# Patient Record
Sex: Female | Born: 1975
Health system: Southern US, Community
[De-identification: ages and names within clinical notes are randomized; demographics above are authoritative.]

## PROBLEM LIST (undated history)

## (undated) ENCOUNTER — Emergency Department (HOSPITAL_COMMUNITY): Admission: EM | Payer: Self-pay | Source: Home / Self Care

## (undated) DIAGNOSIS — F419 Anxiety disorder, unspecified: Secondary | ICD-10-CM

## (undated) DIAGNOSIS — G8929 Other chronic pain: Secondary | ICD-10-CM

## (undated) DIAGNOSIS — T7840XA Allergy, unspecified, initial encounter: Secondary | ICD-10-CM

## (undated) DIAGNOSIS — M1990 Unspecified osteoarthritis, unspecified site: Secondary | ICD-10-CM

## (undated) DIAGNOSIS — Z789 Other specified health status: Secondary | ICD-10-CM

## (undated) HISTORY — DX: Anxiety disorder, unspecified: F41.9

## (undated) HISTORY — PX: NO PAST SURGERIES: SHX2092

## (undated) HISTORY — DX: Unspecified osteoarthritis, unspecified site: M19.90

## (undated) HISTORY — DX: Other specified health status: Z78.9

## (undated) HISTORY — DX: Other chronic pain: G89.29

## (undated) HISTORY — DX: Allergy, unspecified, initial encounter: T78.40XA

---

## 2003-06-10 ENCOUNTER — Encounter: Payer: Self-pay | Admitting: Family Medicine

## 2003-06-10 ENCOUNTER — Ambulatory Visit (HOSPITAL_COMMUNITY): Admission: RE | Admit: 2003-06-10 | Discharge: 2003-06-10 | Payer: Self-pay | Admitting: Family Medicine

## 2010-04-04 ENCOUNTER — Emergency Department (HOSPITAL_COMMUNITY): Admission: EM | Admit: 2010-04-04 | Discharge: 2010-04-04 | Payer: Self-pay | Admitting: Emergency Medicine

## 2012-07-02 ENCOUNTER — Other Ambulatory Visit (HOSPITAL_COMMUNITY): Payer: Self-pay | Admitting: Family Medicine

## 2012-07-02 DIAGNOSIS — N6320 Unspecified lump in the left breast, unspecified quadrant: Secondary | ICD-10-CM

## 2012-07-09 ENCOUNTER — Other Ambulatory Visit (HOSPITAL_COMMUNITY): Payer: Self-pay | Admitting: Family Medicine

## 2012-07-09 ENCOUNTER — Ambulatory Visit (HOSPITAL_COMMUNITY)
Admission: RE | Admit: 2012-07-09 | Discharge: 2012-07-09 | Disposition: A | Discharge status: Home / Self Care | Payer: Self-pay | Source: Ambulatory Visit | Attending: Family Medicine | Admitting: Family Medicine

## 2012-07-09 DIAGNOSIS — N6320 Unspecified lump in the left breast, unspecified quadrant: Secondary | ICD-10-CM

## 2012-07-09 DIAGNOSIS — Z803 Family history of malignant neoplasm of breast: Secondary | ICD-10-CM | POA: Insufficient documentation

## 2012-07-09 DIAGNOSIS — N6459 Other signs and symptoms in breast: Secondary | ICD-10-CM | POA: Insufficient documentation

## 2012-12-26 ENCOUNTER — Other Ambulatory Visit (HOSPITAL_COMMUNITY): Payer: Self-pay | Admitting: Internal Medicine

## 2012-12-26 DIAGNOSIS — K219 Gastro-esophageal reflux disease without esophagitis: Secondary | ICD-10-CM

## 2012-12-26 DIAGNOSIS — R1310 Dysphagia, unspecified: Secondary | ICD-10-CM

## 2012-12-26 DIAGNOSIS — R1314 Dysphagia, pharyngoesophageal phase: Secondary | ICD-10-CM

## 2012-12-29 ENCOUNTER — Ambulatory Visit (HOSPITAL_COMMUNITY)
Admission: RE | Admit: 2012-12-29 | Discharge: 2012-12-29 | Disposition: A | Discharge status: Home / Self Care | Payer: BC Managed Care – PPO | Source: Ambulatory Visit | Attending: Internal Medicine | Admitting: Internal Medicine

## 2012-12-29 DIAGNOSIS — R1310 Dysphagia, unspecified: Secondary | ICD-10-CM | POA: Insufficient documentation

## 2012-12-29 DIAGNOSIS — K219 Gastro-esophageal reflux disease without esophagitis: Secondary | ICD-10-CM

## 2012-12-29 DIAGNOSIS — R1314 Dysphagia, pharyngoesophageal phase: Secondary | ICD-10-CM

## 2013-08-06 ENCOUNTER — Encounter (INDEPENDENT_AMBULATORY_CARE_PROVIDER_SITE_OTHER): Payer: Self-pay

## 2013-08-06 ENCOUNTER — Ambulatory Visit (INDEPENDENT_AMBULATORY_CARE_PROVIDER_SITE_OTHER): Payer: BC Managed Care – PPO | Admitting: Otolaryngology

## 2013-08-06 DIAGNOSIS — H903 Sensorineural hearing loss, bilateral: Secondary | ICD-10-CM

## 2014-08-08 ENCOUNTER — Emergency Department (HOSPITAL_COMMUNITY)
Admission: EM | Admit: 2014-08-08 | Discharge: 2014-08-09 | Disposition: A | Discharge status: Home / Self Care | Payer: BC Managed Care – PPO | Attending: Emergency Medicine | Admitting: Emergency Medicine

## 2014-08-08 ENCOUNTER — Encounter (HOSPITAL_COMMUNITY): Payer: Self-pay

## 2014-08-08 DIAGNOSIS — M25519 Pain in unspecified shoulder: Secondary | ICD-10-CM | POA: Insufficient documentation

## 2014-08-08 DIAGNOSIS — R1010 Upper abdominal pain, unspecified: Secondary | ICD-10-CM | POA: Diagnosis present

## 2014-08-08 DIAGNOSIS — Z3202 Encounter for pregnancy test, result negative: Secondary | ICD-10-CM | POA: Insufficient documentation

## 2014-08-08 DIAGNOSIS — Z791 Long term (current) use of non-steroidal anti-inflammatories (NSAID): Secondary | ICD-10-CM | POA: Insufficient documentation

## 2014-08-08 DIAGNOSIS — R1013 Epigastric pain: Secondary | ICD-10-CM

## 2014-08-08 DIAGNOSIS — Z79899 Other long term (current) drug therapy: Secondary | ICD-10-CM | POA: Diagnosis not present

## 2014-08-08 LAB — CBC WITH DIFFERENTIAL/PLATELET
Basophils Absolute: 0 10*3/uL (ref 0.0–0.1)
Basophils Relative: 1 % (ref 0–1)
Eosinophils Absolute: 0.1 10*3/uL (ref 0.0–0.7)
Eosinophils Relative: 2 % (ref 0–5)
HCT: 34 % — ABNORMAL LOW (ref 36.0–46.0)
Hemoglobin: 12.1 g/dL (ref 12.0–15.0)
Lymphocytes Relative: 30 % (ref 12–46)
Lymphs Abs: 1.7 10*3/uL (ref 0.7–4.0)
MCH: 31.4 pg (ref 26.0–34.0)
MCHC: 35.6 g/dL (ref 30.0–36.0)
MCV: 88.3 fL (ref 78.0–100.0)
Monocytes Absolute: 0.3 10*3/uL (ref 0.1–1.0)
Monocytes Relative: 6 % (ref 3–12)
Neutro Abs: 3.5 10*3/uL (ref 1.7–7.7)
Neutrophils Relative %: 61 % (ref 43–77)
Platelets: 248 10*3/uL (ref 150–400)
RBC: 3.85 MIL/uL — ABNORMAL LOW (ref 3.87–5.11)
RDW: 11.8 % (ref 11.5–15.5)
WBC: 5.7 10*3/uL (ref 4.0–10.5)

## 2014-08-08 LAB — URINALYSIS, ROUTINE W REFLEX MICROSCOPIC
Bilirubin Urine: NEGATIVE
Glucose, UA: NEGATIVE mg/dL
Hgb urine dipstick: NEGATIVE
Ketones, ur: NEGATIVE mg/dL
Leukocytes, UA: NEGATIVE
Nitrite: NEGATIVE
Protein, ur: NEGATIVE mg/dL
Specific Gravity, Urine: <1.005 — ABNORMAL LOW (ref 1.005–1.030)
Urobilinogen, UA: 0.2 mg/dL (ref 0.0–1.0)
pH: 7 (ref 5.0–8.0)

## 2014-08-08 LAB — PREGNANCY, URINE: Preg Test, Ur: NEGATIVE

## 2014-08-08 MED ORDER — GI COCKTAIL ~~LOC~~
30.0000 mL | Freq: Once | ORAL | Status: AC
  Administered 2014-08-08: 30 mL via ORAL
  Filled 2014-08-08: qty 30

## 2014-08-08 MED ORDER — PANTOPRAZOLE SODIUM 40 MG PO TBEC
40.0000 mg | DELAYED_RELEASE_TABLET | Freq: Once | ORAL | Status: AC
  Administered 2014-08-08: 40 mg via ORAL
  Filled 2014-08-08: qty 1

## 2014-08-08 NOTE — ED Notes (Signed)
Patient states upper middle abdominal pain with reflux that is worse after she eats. Patient states bilateral upper back pain. Patient denies chest pain, but states shortness of breath.

## 2014-08-08 NOTE — ED Provider Notes (Signed)
CSN: [id number]     Arrival date & time 08/08/14  2126 History  This chart was scribed for Delora Fuel, MD by Randa Evens, ED Scribe. This patient was seen in room APA11/APA11 and the patient's care was started at 11:03 PM.    Chief Complaint  Patient presents with  . Abdominal Pain   Patient is a 38 y.o. female presenting with abdominal pain. The history is provided by the patient. No language interpreter was used.  Abdominal Pain Associated symptoms: no constipation, no diarrhea, no fever, no nausea and no vomiting    HPI Comments: Judy Thompson is a 38 y.o. female who presents to the Emergency Department complaining of progressively worsening sharp upper abdominal pain onset 4 days ago. Pt states she has had some shoulder pain but does not think it is related. Pt rates the pain 7/10. Pt describes the pain as indigestion. Pt states that after eating the pain worsens. Pt states she has tried antiacids that provide temporary relief. Pt states she has also taken Zantac with no relief. Pt denies n/v/d, constipation or fever.   History reviewed. No pertinent past medical history. History reviewed. No pertinent past surgical history. No family history on file. History  Substance Use Topics  . Smoking status: Never Smoker   . Smokeless tobacco: Not on file  . Alcohol Use: No   OB History    No data available     Review of Systems  Constitutional: Negative for fever.  Gastrointestinal: Positive for abdominal pain. Negative for nausea, vomiting, diarrhea and constipation.  All other systems reviewed and are negative.   Allergies  Review of patient's allergies indicates no known allergies.  Home Medications   Prior to Admission medications   Medication Sig Start Date End Date Taking? Authorizing Provider  baclofen (LIORESAL) 10 MG tablet Take 10 mg by mouth daily as needed. 07/10/14  Yes Historical Provider, MD  naproxen (NAPROSYN) 500 MG tablet Take 500 mg by mouth 2 (two)  times daily. 07/18/14  Yes Historical Provider, MD  Norgestimate-Ethinyl Estradiol Triphasic (TRI-SPRINTEC) 0.18/0.215/0.25 MG-35 MCG tablet Take 1 tablet by mouth daily.   Yes Historical Provider, MD  ranitidine (ZANTAC) 150 MG tablet Take 150 mg by mouth 2 (two) times daily. 07/18/14  Yes Historical Provider, MD  zonisamide (ZONEGRAN) 50 MG capsule Take 50 mg by mouth 2 (two) times daily. 07/10/14  Yes Historical Provider, MD   Triage Vitals: BP 104/89 mmHg  Pulse 69  Temp(Src) 97.8 F (36.6 C) (Oral)  Resp 20  Ht 5\' 3"  (1.6 m)  Wt 153 lb (69.4 kg)  BMI 27.11 kg/m2  SpO2 100%  LMP 07/25/2014  Physical Exam  Constitutional: She is oriented to person, place, and time. She appears well-developed and well-nourished. No distress.  HENT:  Head: Normocephalic and atraumatic.  Eyes: Conjunctivae and EOM are normal.  Neck: Neck supple. No tracheal deviation present.  Cardiovascular: Normal rate.   Pulmonary/Chest: Effort normal. No respiratory distress.  Abdominal: There is tenderness.  Mild tenderness epigastric area.   Musculoskeletal: Normal range of motion.  Neurological: She is alert and oriented to person, place, and time.  Skin: Skin is warm and dry.  Psychiatric: She has a normal mood and affect. Her behavior is normal.  Nursing note and vitals reviewed.   ED Course  Procedures (including critical care time) DIAGNOSTIC STUDIES: Oxygen Saturation is 100% on RA, normal by my interpretation.    COORDINATION OF CARE: 11:10 PM-Discussed treatment plan which includes GI cocktail,  pantoprazole, CBC panel, CMP, and UA with pt at bedside and pt agreed to plan.     Labs Review Results for orders placed or performed during the hospital encounter of 08/08/14  Urinalysis, Routine w reflex microscopic  Result Value Ref Range   Color, Urine YELLOW YELLOW   APPearance CLEAR CLEAR   Specific Gravity, Urine <1.005 (L) 1.005 - 1.030   pH 7.0 5.0 - 8.0   Glucose, UA NEGATIVE NEGATIVE  mg/dL   Hgb urine dipstick NEGATIVE NEGATIVE   Bilirubin Urine NEGATIVE NEGATIVE   Ketones, ur NEGATIVE NEGATIVE mg/dL   Protein, ur NEGATIVE NEGATIVE mg/dL   Urobilinogen, UA 0.2 0.0 - 1.0 mg/dL   Nitrite NEGATIVE NEGATIVE   Leukocytes, UA NEGATIVE NEGATIVE  Pregnancy, urine  Result Value Ref Range   Preg Test, Ur NEGATIVE NEGATIVE  Comprehensive metabolic panel  Result Value Ref Range   Sodium 140 137 - 147 mEq/L   Potassium 3.7 3.7 - 5.3 mEq/L   Chloride 104 96 - 112 mEq/L   CO2 26 19 - 32 mEq/L   Glucose, Bld 102 (H) 70 - 99 mg/dL   BUN 14 6 - 23 mg/dL   Creatinine, Ser 0.88 0.50 - 1.10 mg/dL   Calcium 9.3 8.4 - 10.5 mg/dL   Total Protein 6.8 6.0 - 8.3 g/dL   Albumin 4.0 3.5 - 5.2 g/dL   AST 13 0 - 37 U/L   ALT 12 0 - 35 U/L   Alkaline Phosphatase 105 39 - 117 U/L   Total Bilirubin <0.2 (L) 0.3 - 1.2 mg/dL   GFR calc non Af Amer 84 (L) >90 mL/min   GFR calc Af Amer >90 >90 mL/min   Anion gap 10 5 - 15  Lipase, blood  Result Value Ref Range   Lipase 47 11 - 59 U/L  CBC with Differential  Result Value Ref Range   WBC 5.7 4.0 - 10.5 K/uL   RBC 3.85 (L) 3.87 - 5.11 MIL/uL   Hemoglobin 12.1 12.0 - 15.0 g/dL   HCT 34.0 (L) 36.0 - 46.0 %   MCV 88.3 78.0 - 100.0 fL   MCH 31.4 26.0 - 34.0 pg   MCHC 35.6 30.0 - 36.0 g/dL   RDW 11.8 11.5 - 15.5 %   Platelets 248 150 - 400 K/uL   Neutrophils Relative % 61 43 - 77 %   Neutro Abs 3.5 1.7 - 7.7 K/uL   Lymphocytes Relative 30 12 - 46 %   Lymphs Abs 1.7 0.7 - 4.0 K/uL   Monocytes Relative 6 3 - 12 %   Monocytes Absolute 0.3 0.1 - 1.0 K/uL   Eosinophils Relative 2 0 - 5 %   Eosinophils Absolute 0.1 0.0 - 0.7 K/uL   Basophils Relative 1 0 - 1 %   Basophils Absolute 0.0 0.0 - 0.1 K/uL   MDM   Final diagnoses:  Epigastric pain       Epigastric pain concerning for possible gastritis or peptic ulcer disease. Doubt biliary tract disease. However, patient expresses concern about possible gallstones. She's given a  therapeutic trial of GI cocktail plus pantoprazole. Laboratory workup has come back, unremarkable including normal transaminases, alkaline phosphatase, bilirubin, and WBC.  She got partial relief of pain with GI cocktail. Pain is down to 5/10. Because of her concern for gallstones, outpatient gallbladder ultrasound is ordered. She is discharged with prescription for pantoprazole and is also give a take-home pack of oxycodone-acetaminophen. Follow up with PCP in the next week.  I personally performed the services described in this documentation, which was scribed in my presence. The recorded information has been reviewed and is accurate.       Delora Fuel, MD 19/59/74 [id number]

## 2014-08-08 NOTE — ED Notes (Signed)
Pt states upper abdominal pain for the past few days. Pt gets some relief from antiacids but returns after a few minutes. Pt denies any N/V/D.

## 2014-08-09 ENCOUNTER — Other Ambulatory Visit (HOSPITAL_COMMUNITY): Payer: Self-pay | Admitting: Emergency Medicine

## 2014-08-09 ENCOUNTER — Ambulatory Visit (HOSPITAL_COMMUNITY)
Admit: 2014-08-09 | Discharge: 2014-08-09 | Disposition: A | Discharge status: Home / Self Care | Payer: BC Managed Care – PPO | Source: Ambulatory Visit | Attending: Emergency Medicine | Admitting: Emergency Medicine

## 2014-08-09 DIAGNOSIS — R1013 Epigastric pain: Secondary | ICD-10-CM

## 2014-08-09 LAB — COMPREHENSIVE METABOLIC PANEL
ALT: 12 U/L (ref 0–35)
AST: 13 U/L (ref 0–37)
Albumin: 4 g/dL (ref 3.5–5.2)
Alkaline Phosphatase: 105 U/L (ref 39–117)
Anion gap: 10 (ref 5–15)
BUN: 14 mg/dL (ref 6–23)
CO2: 26 mEq/L (ref 19–32)
Calcium: 9.3 mg/dL (ref 8.4–10.5)
Chloride: 104 mEq/L (ref 96–112)
Creatinine, Ser: 0.88 mg/dL (ref 0.50–1.10)
GFR calc Af Amer: >90 mL/min (ref >90)
GFR calc non Af Amer: 84 mL/min — ABNORMAL LOW (ref >90)
Glucose, Bld: 102 mg/dL — ABNORMAL HIGH (ref 70–99)
Potassium: 3.7 mEq/L (ref 3.7–5.3)
Sodium: 140 mEq/L (ref 137–147)
Total Bilirubin: <0.2 mg/dL — ABNORMAL LOW (ref 0.3–1.2)
Total Protein: 6.8 g/dL (ref 6.0–8.3)

## 2014-08-09 LAB — LIPASE, BLOOD: Lipase: 47 U/L (ref 11–59)

## 2014-08-09 MED ORDER — OXYCODONE-ACETAMINOPHEN 5-325 MG PO TABS: 1.0000 | ORAL_TABLET | ORAL | Status: DC | PRN

## 2014-08-09 MED ORDER — OXYCODONE-ACETAMINOPHEN 5-325 MG PO TABS
1.0000 | ORAL_TABLET | Freq: Once | ORAL | Status: AC
  Administered 2014-08-09: 1 via ORAL
  Filled 2014-08-09: qty 1

## 2014-08-09 MED ORDER — PANTOPRAZOLE SODIUM 40 MG PO TBEC: 40.0000 mg | DELAYED_RELEASE_TABLET | Freq: Every day | ORAL | Status: DC

## 2014-08-09 NOTE — ED Notes (Signed)
Pt alert & oriented x4, stable gait. Patient given discharge instructions, paperwork & prescription(s). Patient  instructed to stop at the registration desk to finish any additional paperwork. Patient verbalized understanding. Pt left department w/ no further questions. 

## 2014-08-09 NOTE — ED Provider Notes (Signed)
Pt returns for outpatient abdominal US.    Pt given her test results. Encouraged to get the prescription for PPI filled and to f/u with Dr Gerarda Fraction if her symptoms aren't improving.    US Abdomen Complete  08/09/2014   CLINICAL DATA:  Epigastric pain for several days  EXAM: ULTRASOUND ABDOMEN COMPLETE  COMPARISON:  None.  FINDINGS: Gallbladder: No gallstones or wall thickening visualized. No sonographic Murphy sign noted.  Common bile duct: Diameter: Normal at 2 mm  Liver: No focal lesion identified. Within normal limits in parenchymal echogenicity.  IVC: No abnormality visualized.  Pancreas: Visualized portion unremarkable.  Spleen: Size and appearance within normal limits.  Right Kidney: Length: 11.2 cm. Echogenicity within normal limits. No mass or hydronephrosis visualized.  Left Kidney: Length: 11.6 cm. Echogenicity within normal limits. No mass or hydronephrosis visualized.  Abdominal aorta: No aneurysm visualized.  Other findings: No free fluid  IMPRESSION: Normal abdominal ultrasound.   Electronically Signed   By: Suzy Bouchard M.D.   On: 08/09/2014 14:08    Rolland Porter, MD, Alanson Aly, MD 08/09/14 [phone]

## 2014-08-09 NOTE — Discharge Instructions (Signed)
Abdominal Pain Many things can cause abdominal pain. Usually, abdominal pain is not caused by a disease and will improve without treatment. It can often be observed and treated at home. Your health care provider will do a physical exam and possibly order blood tests and X-rays to help determine the seriousness of your pain. However, in many cases, more time must pass before a clear cause of the pain can be found. Before that point, your health care provider may not know if you need more testing or further treatment. HOME CARE INSTRUCTIONS  Monitor your abdominal pain for any changes. The following actions may help to alleviate any discomfort you are experiencing:  Only take over-the-counter or prescription medicines as directed by your health care provider.  Do not take laxatives unless directed to do so by your health care provider.  Try a clear liquid diet (broth, tea, or water) as directed by your health care provider. Slowly move to a bland diet as tolerated. SEEK MEDICAL CARE IF:  You have unexplained abdominal pain.  You have abdominal pain associated with nausea or diarrhea.  You have pain when you urinate or have a bowel movement.  You experience abdominal pain that wakes you in the night.  You have abdominal pain that is worsened or improved by eating food.  You have abdominal pain that is worsened with eating fatty foods.  You have a fever. SEEK IMMEDIATE MEDICAL CARE IF:   Your pain does not go away within 2 hours.  You keep throwing up (vomiting).  Your pain is felt only in portions of the abdomen, such as the right side or the left lower portion of the abdomen.  You pass bloody or black tarry stools. MAKE SURE YOU:  Understand these instructions.   Will watch your condition.   Will get help right away if you are not doing well or get worse.  Document Released: 06/20/2005 Document Revised: 09/15/2013 Document Reviewed: 05/20/2013 [facility] Patient Information  2015 [place], [place]. This information is not intended to replace advice given to you by your health care provider. Make sure you discuss any questions you have with your health care provider.  Pantoprazole tablets What is this medicine? PANTOPRAZOLE (pan TOE pra zole) prevents the production of acid in the stomach. It is used to treat gastroesophageal reflux disease (GERD), inflammation of the esophagus, and Zollinger-Ellison syndrome. This medicine may be used for other purposes; ask your health care provider or pharmacist if you have questions. COMMON BRAND NAME(S): Protonix What should I tell my health care provider before I take this medicine? They need to know if you have any of these conditions: -liver disease -low levels of magnesium in the blood -an unusual or allergic reaction to omeprazole, lansoprazole, pantoprazole, rabeprazole, other medicines, foods, dyes, or preservatives -pregnant or trying to get pregnant -breast-feeding How should I use this medicine? Take this medicine by mouth. Swallow the tablets whole with a drink of water. Follow the directions on the prescription label. Do not crush, break, or chew. Take your medicine at regular intervals. Do not take your medicine more often than directed. Talk to your pediatrician regarding the use of this medicine in children. While this drug may be prescribed for children as young as 5 years for selected conditions, precautions do apply. Overdosage: If you think you have taken too much of this medicine contact a poison control center or emergency room at once. NOTE: This medicine is only for you. Do not share this medicine with others.  What if I miss a dose? If you miss a dose, take it as soon as you can. If it is almost time for your next dose, take only that dose. Do not take double or extra doses. What may interact with this medicine? Do not take this medicine with any of the following medications: -atazanavir -nelfinavir This  medicine may also interact with the following medications: -ampicillin -delavirdine -digoxin -diuretics -iron salts -medicines for fungal infections like ketoconazole, itraconazole and voriconazole -warfarin This list may not describe all possible interactions. Give your health care provider a list of all the medicines, herbs, non-prescription drugs, or dietary supplements you use. Also tell them if you smoke, drink alcohol, or use illegal drugs. Some items may interact with your medicine. What should I watch for while using this medicine? It can take several days before your stomach pain gets better. Check with your doctor or health care professional if your condition does not start to get better, or if it gets worse. You may need blood work done while you are taking this medicine. What side effects may I notice from receiving this medicine? Side effects that you should report to your doctor or health care professional as soon as possible: -allergic reactions like skin rash, itching or hives, swelling of the face, lips, or tongue -bone, muscle or joint pain -breathing problems -chest pain or chest tightness -dark yellow or brown urine -dizziness -fast, irregular heartbeat -feeling faint or lightheaded -fever or sore throat -muscle spasm -palpitations -redness, blistering, peeling or loosening of the skin, including inside the mouth -seizures -tremors -unusual bleeding or bruising -unusually weak or tired -yellowing of the eyes or skin Side effects that usually do not require medical attention (Report these to your doctor or health care professional if they continue or are bothersome.): -constipation -diarrhea -dry mouth -headache -nausea This list may not describe all possible side effects. Call your doctor for medical advice about side effects. You may report side effects to FDA at 1-800-FDA-1088. Where should I keep my medicine? Keep out of the reach of children. Store at  room temperature between 15 and 30 degrees C (59 and 86 degrees F). Protect from light and moisture. Throw away any unused medicine after the expiration date. NOTE: This sheet is a summary. It may not cover all possible information. If you have questions about this medicine, talk to your doctor, pharmacist, or health care provider.  2015, [employer] Standard. (2012-07-09 16:40:16)

## 2014-08-12 ENCOUNTER — Ambulatory Visit (INDEPENDENT_AMBULATORY_CARE_PROVIDER_SITE_OTHER): Payer: BC Managed Care – PPO | Admitting: Otolaryngology

## 2016-01-02 DIAGNOSIS — Z1389 Encounter for screening for other disorder: Secondary | ICD-10-CM | POA: Diagnosis not present

## 2016-01-02 DIAGNOSIS — Z6822 Body mass index (BMI) 22.0-22.9, adult: Secondary | ICD-10-CM | POA: Diagnosis not present

## 2016-01-02 DIAGNOSIS — Z3201 Encounter for pregnancy test, result positive: Secondary | ICD-10-CM | POA: Diagnosis not present

## 2016-01-02 DIAGNOSIS — N925 Other specified irregular menstruation: Secondary | ICD-10-CM | POA: Diagnosis not present

## 2016-01-10 ENCOUNTER — Encounter (HOSPITAL_COMMUNITY): Payer: Self-pay

## 2016-01-10 ENCOUNTER — Emergency Department (HOSPITAL_COMMUNITY)
Admission: EM | Admit: 2016-01-10 | Discharge: 2016-01-10 | Disposition: A | Discharge status: Home / Self Care | Payer: BLUE CROSS/BLUE SHIELD | Attending: Emergency Medicine | Admitting: Emergency Medicine

## 2016-01-10 ENCOUNTER — Emergency Department (HOSPITAL_COMMUNITY): Payer: BLUE CROSS/BLUE SHIELD

## 2016-01-10 DIAGNOSIS — O9989 Other specified diseases and conditions complicating pregnancy, childbirth and the puerperium: Secondary | ICD-10-CM | POA: Diagnosis not present

## 2016-01-10 DIAGNOSIS — O468X1 Other antepartum hemorrhage, first trimester: Secondary | ICD-10-CM

## 2016-01-10 DIAGNOSIS — O209 Hemorrhage in early pregnancy, unspecified: Secondary | ICD-10-CM | POA: Diagnosis not present

## 2016-01-10 DIAGNOSIS — Z79899 Other long term (current) drug therapy: Secondary | ICD-10-CM | POA: Insufficient documentation

## 2016-01-10 DIAGNOSIS — N939 Abnormal uterine and vaginal bleeding, unspecified: Secondary | ICD-10-CM | POA: Diagnosis not present

## 2016-01-10 DIAGNOSIS — Z3490 Encounter for supervision of normal pregnancy, unspecified, unspecified trimester: Secondary | ICD-10-CM

## 2016-01-10 DIAGNOSIS — O26851 Spotting complicating pregnancy, first trimester: Secondary | ICD-10-CM | POA: Insufficient documentation

## 2016-01-10 DIAGNOSIS — O208 Other hemorrhage in early pregnancy: Secondary | ICD-10-CM | POA: Diagnosis not present

## 2016-01-10 DIAGNOSIS — Z3A01 Less than 8 weeks gestation of pregnancy: Secondary | ICD-10-CM | POA: Diagnosis not present

## 2016-01-10 DIAGNOSIS — O418X10 Other specified disorders of amniotic fluid and membranes, first trimester, not applicable or unspecified: Secondary | ICD-10-CM

## 2016-01-10 LAB — BASIC METABOLIC PANEL
Anion gap: 11 (ref 5–15)
BUN: 11 mg/dL (ref 6–20)
CO2: 22 mmol/L (ref 22–32)
Calcium: 9.3 mg/dL (ref 8.9–10.3)
Chloride: 103 mmol/L (ref 101–111)
Creatinine, Ser: 0.64 mg/dL (ref 0.44–1.00)
GFR calc Af Amer: >60 mL/min (ref >60)
GFR calc non Af Amer: >60 mL/min (ref >60)
Glucose, Bld: 87 mg/dL (ref 65–99)
Potassium: 3.5 mmol/L (ref 3.5–5.1)
Sodium: 136 mmol/L (ref 135–145)

## 2016-01-10 LAB — CBC
HCT: 35.1 % — ABNORMAL LOW (ref 36.0–46.0)
Hemoglobin: 12.2 g/dL (ref 12.0–15.0)
MCH: 30.7 pg (ref 26.0–34.0)
MCHC: 34.8 g/dL (ref 30.0–36.0)
MCV: 88.2 fL (ref 78.0–100.0)
Platelets: 231 10*3/uL (ref 150–400)
RBC: 3.98 MIL/uL (ref 3.87–5.11)
RDW: 11.6 % (ref 11.5–15.5)
WBC: 9.5 10*3/uL (ref 4.0–10.5)

## 2016-01-10 LAB — HCG, QUANTITATIVE, PREGNANCY: hCG, Beta Chain, Quant, S: 28285 m[IU]/mL — ABNORMAL HIGH (ref <5)

## 2016-01-10 NOTE — ED Notes (Signed)
Pt [redacted]wks pregnant. New onset cramping and spotting tonight.

## 2016-01-10 NOTE — Discharge Instructions (Signed)

## 2016-01-10 NOTE — ED Provider Notes (Signed)
CSN: [id number]     Arrival date & time 01/10/16  0026 History   First MD Initiated Contact with Patient 01/10/16 [phone]     Chief Complaint  Patient presents with  . Abdominal Pain  . Vaginal Bleeding     HPI Patient is currently [redacted] weeks pregnant per her.  Her last menstrual period was proximal 6 weeks ago.  She developed new onset vaginal spotting and left lower quadrant crampy abdominal pain today which is what prompted her visit to the emergency department.  She is a G2 P1 with prior miscarriage 13 years ago.  She has a scheduled appointment with her obstetrician in 2 days.  No lightheadedness or weakness.  She reports the vaginal spotting a scant   History reviewed. No pertinent past medical history. History reviewed. No pertinent past surgical history. History reviewed. No pertinent family history. Social History  Substance Use Topics  . Smoking status: Never Smoker   . Smokeless tobacco: None  . Alcohol Use: No   OB History    No data available     Review of Systems  All other systems reviewed and are negative.     Allergies  Review of patient's allergies indicates no known allergies.  Home Medications   Prior to Admission medications   Medication Sig Start Date End Date Taking? Authorizing Provider  acetaminophen (TYLENOL) 500 MG tablet Take 500 mg by mouth every 6 (six) hours as needed for mild pain.   Yes Historical Provider, MD  Prenatal Vit-Fe Fumarate-FA (PRENATAL MULTIVITAMIN) TABS tablet Take 1 tablet by mouth daily at 12 noon.   Yes Historical Provider, MD   BP 120/65 mmHg  Pulse 78  Temp(Src) 97.8 F (36.6 C) (Oral)  Resp 20  Ht 5\' 6"  (1.676 m)  Wt 143 lb 6 oz (65.034 kg)  BMI 23.15 kg/m2  SpO2 98% Physical Exam  Constitutional: She is oriented to person, place, and time. She appears well-developed and well-nourished. No distress.  HENT:  Head: Normocephalic and atraumatic.  Eyes: EOM are normal.  Neck: Normal range of motion.  Cardiovascular:  Normal rate and regular rhythm.   Pulmonary/Chest: Effort normal.  Abdominal: Soft. She exhibits no distension. There is no tenderness.  Musculoskeletal: Normal range of motion.  Neurological: She is alert and oriented to person, place, and time.  Skin: Skin is warm and dry.  Psychiatric: She has a normal mood and affect. Judgment normal.  Nursing note and vitals reviewed.   ED Course  Procedures (including critical care time) Labs Review Labs Reviewed  HCG, QUANTITATIVE, PREGNANCY - Abnormal; Notable for the following:    hCG, Beta Chain, Quant, S 28285 (*)    All other components within normal limits  CBC - Abnormal; Notable for the following:    HCT 35.1 (*)    All other components within normal limits  BASIC METABOLIC PANEL    Imaging Review US Ob Comp Less 14 Wks  01/10/2016  CLINICAL DATA:  Vaginal bleeding since last night. Estimated gestational age by LMP is 7 weeks 0 days. Quantitative beta HCG is 20,285. EXAM: OBSTETRIC <14 WK [place] AND TRANSVAGINAL OB US TECHNIQUE: Both transabdominal and transvaginal ultrasound examinations were performed for complete evaluation of the gestation as well as the maternal uterus, adnexal regions, and pelvic cul-de-sac. Transvaginal technique was performed to assess early pregnancy. COMPARISON:  None. FINDINGS: Intrauterine gestational sac: A single intrauterine gestational sac is identified. Yolk sac:  Yolk sac is present. Embryo:  A single fetal pole is  identified. Cardiac Activity: Fetal cardiac activity is observed. Heart Rate: Not able to measure due to small size. CRL:  2.9  mm   5 w   6 d                  [place] EDC: 09/05/2016 Subchorionic hemorrhage: A small subchorionic hemorrhage is demonstrated, measuring 1.5 x 2.6 x 2 point 0 cm. Maternal uterus/adnexae: The uterus is retroverted. An exophytic fibroid is demonstrated posteriorly, measuring 2.4 cm maximal diameter. Both ovaries are visualized. Normal follicular changes on the left. Corpus luteal  cyst on the right. No abnormal adnexal masses. Small amount of free fluid in the pelvis. IMPRESSION: Single intrauterine pregnancy demonstrated. Estimated gestational age by crown-rump length is 5 weeks 6 days. Small subchorionic hemorrhage. Incidental note of a small uterine fibroid. Electronically Signed   By: Lucienne Capers M.D.   On: 01/10/2016 05:59   US Ob Transvaginal  01/10/2016  CLINICAL DATA:  Vaginal bleeding since last night. Estimated gestational age by LMP is 7 weeks 0 days. Quantitative beta HCG is 20,285. EXAM: OBSTETRIC <14 WK [place] AND TRANSVAGINAL OB US TECHNIQUE: Both transabdominal and transvaginal ultrasound examinations were performed for complete evaluation of the gestation as well as the maternal uterus, adnexal regions, and pelvic cul-de-sac. Transvaginal technique was performed to assess early pregnancy. COMPARISON:  None. FINDINGS: Intrauterine gestational sac: A single intrauterine gestational sac is identified. Yolk sac:  Yolk sac is present. Embryo:  A single fetal pole is identified. Cardiac Activity: Fetal cardiac activity is observed. Heart Rate: Not able to measure due to small size. CRL:  2.9  mm   5 w   6 d                  [place] EDC: 09/05/2016 Subchorionic hemorrhage: A small subchorionic hemorrhage is demonstrated, measuring 1.5 x 2.6 x 2 point 0 cm. Maternal uterus/adnexae: The uterus is retroverted. An exophytic fibroid is demonstrated posteriorly, measuring 2.4 cm maximal diameter. Both ovaries are visualized. Normal follicular changes on the left. Corpus luteal cyst on the right. No abnormal adnexal masses. Small amount of free fluid in the pelvis. IMPRESSION: Single intrauterine pregnancy demonstrated. Estimated gestational age by crown-rump length is 5 weeks 6 days. Small subchorionic hemorrhage. Incidental note of a small uterine fibroid. Electronically Signed   By: Lucienne Capers M.D.   On: 01/10/2016 05:59   I have personally reviewed and evaluated these images and  lab results as part of my medical decision-making.   EKG Interpretation None      MDM   Final diagnoses:  Intrauterine pregnancy  Subchorionic hemorrhage in first trimester  Vaginal spotting    Overall well-appearing.  Vital signs normal.  Hemoglobin normal.  Small subchorionic hemorrhage noted.  Scant vaginal spotting.  Patient has follow-up with OB/GYN artery scheduled this week for Thursday.  Discharge home in good condition.  Miscarriage precautions given.    Jola Schmidt, MD 01/10/16 [phone]

## 2016-01-12 ENCOUNTER — Ambulatory Visit (INDEPENDENT_AMBULATORY_CARE_PROVIDER_SITE_OTHER): Payer: BLUE CROSS/BLUE SHIELD | Admitting: Advanced Practice Midwife

## 2016-01-12 ENCOUNTER — Encounter: Payer: Self-pay | Admitting: Advanced Practice Midwife

## 2016-01-12 ENCOUNTER — Encounter (INDEPENDENT_AMBULATORY_CARE_PROVIDER_SITE_OTHER): Payer: BLUE CROSS/BLUE SHIELD | Admitting: Advanced Practice Midwife

## 2016-01-12 VITALS — BP 110/70 | HR 80 | Wt 143.0 lb

## 2016-01-12 DIAGNOSIS — Z3491 Encounter for supervision of normal pregnancy, unspecified, first trimester: Secondary | ICD-10-CM

## 2016-01-12 DIAGNOSIS — O09521 Supervision of elderly multigravida, first trimester: Secondary | ICD-10-CM

## 2016-01-12 DIAGNOSIS — Z331 Pregnant state, incidental: Secondary | ICD-10-CM

## 2016-01-12 DIAGNOSIS — Z0283 Encounter for blood-alcohol and blood-drug test: Secondary | ICD-10-CM

## 2016-01-12 DIAGNOSIS — Z1389 Encounter for screening for other disorder: Secondary | ICD-10-CM

## 2016-01-12 DIAGNOSIS — Z3490 Encounter for supervision of normal pregnancy, unspecified, unspecified trimester: Secondary | ICD-10-CM | POA: Insufficient documentation

## 2016-01-12 DIAGNOSIS — Z36 Encounter for antenatal screening of mother: Secondary | ICD-10-CM | POA: Diagnosis not present

## 2016-01-12 DIAGNOSIS — O09529 Supervision of elderly multigravida, unspecified trimester: Secondary | ICD-10-CM | POA: Insufficient documentation

## 2016-01-12 DIAGNOSIS — Z369 Encounter for antenatal screening, unspecified: Secondary | ICD-10-CM

## 2016-01-12 DIAGNOSIS — Z0189 Encounter for other specified special examinations: Secondary | ICD-10-CM | POA: Diagnosis not present

## 2016-01-12 LAB — POCT URINALYSIS DIPSTICK
Blood, UA: 1
Glucose, UA: NEGATIVE
Ketones, UA: NEGATIVE
Leukocytes, UA: NEGATIVE
Nitrite, UA: NEGATIVE
Protein, UA: NEGATIVE

## 2016-01-12 MED ORDER — PRENATE MINI 18-0.6-0.4-350 MG PO CAPS: 1.0000 | ORAL_CAPSULE | Freq: Every day | ORAL | Status: DC

## 2016-01-12 NOTE — Patient Instructions (Signed)

## 2016-01-12 NOTE — Progress Notes (Signed)
  Subjective:    Judy Thompson is a G2P0010 [redacted]w[redacted]d being seen today for her first obstetrical visit.  Her obstetrical history is significant for 1st trimester SAB 14 years ago.  .  Pregnancy history fully reviewed.  She had some spotting a few days ago; US showed viable IUP w/small [facility].  Barely any bleeding today.   Patient reports fatigue.  Filed Vitals:   01/12/16 1420  BP: 110/70  Pulse: 80  Weight: 143 lb (64.864 kg)    HISTORY: OB History  Gravida Para Term Preterm AB SAB TAB Ectopic Multiple Living  2    1 1         # Outcome Date GA Lbr Len/2nd Weight Sex Delivery Anes PTL Lv  2 Current           1 SAB 08/24/02             Past Medical History  Diagnosis Date  . Medical history non-contributory    Past Surgical History  Procedure Laterality Date  . No past surgeries     Family History  Problem Relation Age of Onset  . Hypertension Mother   . Cancer Mother     breast, colon  . Heart disease Father   . Diabetes Paternal Grandmother   . Hypertension Paternal Grandmother      Exam                                      System:     Skin: normal coloration and turgor, no rashes    Neurologic: oriented, normal, normal mood   Extremities: normal strength, tone, and muscle mass   HEENT PERRLA   Mouth/Teeth mucous membranes moist, normal dentition   Neck supple and no masses   Cardiovascular: regular rate and rhythm   Respiratory:  appears well, vitals normal, no respiratory distress, acyanotic   Abdomen: soft, non-tender;  FHR: 110 [place]          Assessment:    Pregnancy: G2P0010 Patient Active Problem List   Diagnosis Date Noted  . Supervision of normal pregnancy 01/12/2016  . AMA (advanced maternal age) multigravida 35+ 01/12/2016        Plan:     Initial labs drawn. Continue prenatal vitamins  Problem list reviewed and updated  Reviewed n/v relief measures and warning s/s to report  Reviewed recommended weight gain based on  pre-gravid BMI  Encouraged well-balanced diet Genetic Screening discussed Integrated Screen: requested.  Ultrasound discussed; fetal survey: requested.  Return in about 3 weeks (around 02/02/2016) for LROB.  CRESENZO-DISHMAN,Stela [place] 01/12/2016

## 2016-01-13 LAB — URINE CULTURE: Organism ID, Bacteria: NO GROWTH

## 2016-01-14 LAB — GC/CHLAMYDIA PROBE AMP
Chlamydia trachomatis, NAA: NEGATIVE
Neisseria gonorrhoeae by PCR: NEGATIVE

## 2016-01-20 LAB — HIV ANTIBODY (ROUTINE TESTING W REFLEX): HIV Screen 4th Generation wRfx: NONREACTIVE

## 2016-01-20 LAB — URINALYSIS, ROUTINE W REFLEX MICROSCOPIC
Bilirubin, UA: NEGATIVE
Glucose, UA: NEGATIVE
Ketones, UA: NEGATIVE
Leukocytes, UA: NEGATIVE
Nitrite, UA: NEGATIVE
Protein, UA: NEGATIVE
Specific Gravity, UA: 1.012 (ref 1.005–1.030)
Urobilinogen, Ur: 0.2 mg/dL (ref 0.2–1.0)
pH, UA: 7 (ref 5.0–7.5)

## 2016-01-20 LAB — HEPATITIS B SURFACE ANTIGEN: Hepatitis B Surface Ag: NEGATIVE

## 2016-01-20 LAB — MICROSCOPIC EXAMINATION
Casts: NONE SEEN /lpf
Epithelial Cells (non renal): >10 /hpf — AB (ref 0–10)

## 2016-01-20 LAB — PMP SCREEN PROFILE (10S), URINE
Amphetamine Screen, Ur: NEGATIVE ng/mL
Barbiturate Screen, Ur: NEGATIVE ng/mL
Benzodiazepine Screen, Urine: NEGATIVE ng/mL
Cannabinoids Ur Ql Scn: NEGATIVE ng/mL
Cocaine(Metab.)Screen, Urine: NEGATIVE ng/mL
Creatinine(Crt), U: 48 mg/dL (ref 20.0–300.0)
Methadone Scn, Ur: NEGATIVE ng/mL
Opiate Scrn, Ur: NEGATIVE ng/mL
Oxycodone+Oxymorphone Ur Ql Scn: NEGATIVE ng/mL
PCP Scrn, Ur: NEGATIVE ng/mL
Ph of Urine: 6.5 (ref 4.5–8.9)
Propoxyphene, Screen: NEGATIVE ng/mL

## 2016-01-20 LAB — RPR: RPR Ser Ql: NONREACTIVE

## 2016-01-20 LAB — ANTIBODY SCREEN: Antibody Screen: NEGATIVE

## 2016-01-20 LAB — CYSTIC FIBROSIS MUTATION 97: Interpretation: NOT DETECTED

## 2016-01-20 LAB — ABO/RH: Rh Factor: NEGATIVE

## 2016-01-20 LAB — RUBELLA SCREEN: Rubella Antibodies, IGG: 1.84 index (ref >0.99)

## 2016-01-20 LAB — VARICELLA ZOSTER ANTIBODY, IGG: Varicella zoster IgG: >4000 index (ref >165)

## 2016-02-02 ENCOUNTER — Ambulatory Visit (INDEPENDENT_AMBULATORY_CARE_PROVIDER_SITE_OTHER): Payer: Medicaid Other | Admitting: Advanced Practice Midwife

## 2016-02-02 ENCOUNTER — Encounter: Payer: Self-pay | Admitting: Advanced Practice Midwife

## 2016-02-02 VITALS — BP 118/60 | HR 84 | Wt 145.0 lb

## 2016-02-02 DIAGNOSIS — O09521 Supervision of elderly multigravida, first trimester: Secondary | ICD-10-CM | POA: Diagnosis not present

## 2016-02-02 DIAGNOSIS — Z1389 Encounter for screening for other disorder: Secondary | ICD-10-CM

## 2016-02-02 DIAGNOSIS — Z3481 Encounter for supervision of other normal pregnancy, first trimester: Secondary | ICD-10-CM

## 2016-02-02 DIAGNOSIS — Z3A01 Less than 8 weeks gestation of pregnancy: Secondary | ICD-10-CM | POA: Diagnosis not present

## 2016-02-02 DIAGNOSIS — Z3491 Encounter for supervision of normal pregnancy, unspecified, first trimester: Secondary | ICD-10-CM

## 2016-02-02 DIAGNOSIS — Z331 Pregnant state, incidental: Secondary | ICD-10-CM

## 2016-02-02 DIAGNOSIS — Z3682 Encounter for antenatal screening for nuchal translucency: Secondary | ICD-10-CM

## 2016-02-02 LAB — POCT URINALYSIS DIPSTICK
Blood, UA: NEGATIVE
Glucose, UA: NEGATIVE
Ketones, UA: NEGATIVE
Leukocytes, UA: NEGATIVE
Nitrite, UA: NEGATIVE
Protein, UA: NEGATIVE

## 2016-02-02 MED ORDER — DOXYLAMINE-PYRIDOXINE 10-10 MG PO TBEC: DELAYED_RELEASE_TABLET | ORAL | Status: DC

## 2016-02-02 NOTE — Patient Instructions (Signed)

## 2016-02-02 NOTE — Progress Notes (Signed)
G2P0010 [redacted]w[redacted]d Estimated Date of Delivery: 09/05/16  Blood pressure 118/60, pulse 84, weight 145 lb (65.772 kg).   BP weight and urine results all reviewed and noted.  Please refer to the obstetrical flow sheet for the fundal height and fetal heart rate documentation:  Patient  denies any bleeding and no rupture of membranes symptoms or regular contractions. Patient is without complaints othr than nausa All questions were answered.  Orders Placed This Encounter  Procedures  . US Fetal Nuchal Translucency Measurement  . POCT urinalysis dipstick    Plan:  Continued routine obstetrical care, sampls of and Rx for dicilgis  Return in about 3 weeks (around 02/23/2016) for LROB, US:NT+1st IT.

## 2016-02-09 DIAGNOSIS — Z029 Encounter for administrative examinations, unspecified: Secondary | ICD-10-CM

## 2016-02-22 ENCOUNTER — Ambulatory Visit (INDEPENDENT_AMBULATORY_CARE_PROVIDER_SITE_OTHER): Payer: BLUE CROSS/BLUE SHIELD | Admitting: Women's Health

## 2016-02-22 ENCOUNTER — Encounter: Payer: Self-pay | Admitting: Women's Health

## 2016-02-22 ENCOUNTER — Ambulatory Visit (INDEPENDENT_AMBULATORY_CARE_PROVIDER_SITE_OTHER): Payer: BLUE CROSS/BLUE SHIELD

## 2016-02-22 VITALS — BP 102/58 | HR 90 | Wt 145.0 lb

## 2016-02-22 DIAGNOSIS — Z36 Encounter for antenatal screening of mother: Secondary | ICD-10-CM

## 2016-02-22 DIAGNOSIS — O09521 Supervision of elderly multigravida, first trimester: Secondary | ICD-10-CM

## 2016-02-22 DIAGNOSIS — Z3A12 12 weeks gestation of pregnancy: Secondary | ICD-10-CM

## 2016-02-22 DIAGNOSIS — Z369 Encounter for antenatal screening, unspecified: Secondary | ICD-10-CM

## 2016-02-22 DIAGNOSIS — Z3481 Encounter for supervision of other normal pregnancy, first trimester: Secondary | ICD-10-CM

## 2016-02-22 DIAGNOSIS — O3491 Maternal care for abnormality of pelvic organ, unspecified, first trimester: Secondary | ICD-10-CM

## 2016-02-22 DIAGNOSIS — Z3491 Encounter for supervision of normal pregnancy, unspecified, first trimester: Secondary | ICD-10-CM

## 2016-02-22 DIAGNOSIS — Z1389 Encounter for screening for other disorder: Secondary | ICD-10-CM

## 2016-02-22 DIAGNOSIS — Z3682 Encounter for antenatal screening for nuchal translucency: Secondary | ICD-10-CM

## 2016-02-22 DIAGNOSIS — Z331 Pregnant state, incidental: Secondary | ICD-10-CM

## 2016-02-22 LAB — POCT URINALYSIS DIPSTICK
Blood, UA: NEGATIVE
Glucose, UA: NEGATIVE
Ketones, UA: NEGATIVE
Nitrite, UA: NEGATIVE
Protein, UA: NEGATIVE

## 2016-02-22 NOTE — Progress Notes (Signed)
Low-risk OB appointment G2P0010 [redacted]w[redacted]d Estimated Date of Delivery: 09/05/16 BP 102/58 mmHg  Pulse 90  Wt 145 lb (65.772 kg)  BP, weight, and urine reviewed.  Refer to obstetrical flow sheet for FH & FHR.  No fm yet. Denies cramping, lof, vb, or uti s/s. Headaches, apap helps some- gave printed prevention/relief measures.  Reviewed today's normal nt u/s, warning s/s to report. Plan:  Continue routine obstetrical care  F/U in 4wks for OB appointment and 2nd IT 1st IT/NT today

## 2016-02-22 NOTE — Progress Notes (Signed)
[place] 12 wks,measurements c/w dates,NB present,NT 1.4 mm,crl 59.0 mm,normal lt ov,simple corpus luteal cyst rt ov 2.7 x 1.7 x 2.4 cm

## 2016-02-22 NOTE — Patient Instructions (Signed)
For Headaches:   Stay well hydrated, drink enough water so that your urine is clear, sometimes if you are dehydrated you can get headaches  Eat small frequent meals and snacks, sometimes if you are hungry you can get headaches  Sometimes you get headaches during pregnancy from the pregnancy hormones  You can try tylenol (1-2 regular strength 325mg  or 1-2 extra strength 500mg ) as directed on the box. The least amount of medication that works is best.   Cool compresses (cool wet washcloth or ice pack) to area of head that is hurting  You can also try drinking a caffeinated drink to see if this will help  If not helping, try below:  For Prevention of Headaches/Migraines:  CoQ10 100mg  three times daily  Vitamin B2 400mg  daily  Magnesium Oxide 400-600mg  daily  If You Get a Bad Headache/Migraine:  Benadryl 25mg    Magnesium Oxide  1 large Gatorade  2 extra strength Tylenol (1,000mg  total)  1 cup coffee or Coke  If this doesn't help please call us @ [phone]   First Trimester of Pregnancy The first trimester of pregnancy is from week 1 until the end of week 12 (months 1 through 3). A week after a sperm fertilizes an egg, the egg will implant on the wall of the uterus. This embryo will begin to develop into a baby. Genes from you and your partner are forming the baby. The female genes determine whether the baby is a boy or a girl. At 6-8 weeks, the eyes and face are formed, and the heartbeat can be seen on ultrasound. At the end of 12 weeks, all the baby's organs are formed.  Now that you are pregnant, you will want to do everything you can to have a healthy baby. Two of the most important things are to get good prenatal care and to follow your health care provider's instructions. Prenatal care is all the medical care you receive before the baby's birth. This care will help prevent, find, and treat any problems during the pregnancy and childbirth. BODY CHANGES Your body goes  through many changes during pregnancy. The changes vary from woman to woman.  4. You may gain or lose a couple of pounds at first. 5. You may feel sick to your stomach (nauseous) and throw up (vomit). If the vomiting is uncontrollable, call your health care provider. 6. You may tire easily. 7. You may develop headaches that can be relieved by medicines approved by your health care provider. 8. You may urinate more often. Painful urination may mean you have a bladder infection. 9. You may develop heartburn as a result of your pregnancy. 10. You may develop constipation because certain hormones are causing the muscles that push waste through your intestines to slow down. 11. You may develop hemorrhoids or swollen, bulging veins (varicose veins). 12. Your breasts may begin to grow larger and become tender. Your nipples may stick out more, and the tissue that surrounds them (areola) may become darker. 13. Your gums may bleed and may be sensitive to brushing and flossing. 14. Dark spots or blotches (chloasma, mask of pregnancy) may develop on your face. This will likely fade after the baby is born. 15. Your menstrual periods will stop. 16. You may have a loss of appetite. 17. You may develop cravings for certain kinds of food. 70. You may have changes in your emotions from day to day, such as being excited to be pregnant or being concerned that something may go wrong with  the pregnancy and baby. 12. You may have more vivid and strange dreams. 20. You may have changes in your hair. These can include thickening of your hair, rapid growth, and changes in texture. Some women also have hair loss during or after pregnancy, or hair that feels dry or thin. Your hair will most likely return to normal after your baby is born. WHAT TO EXPECT AT YOUR PRENATAL VISITS During a routine prenatal visit: 71. You will be weighed to make sure you and the baby are growing normally. 14. Your blood pressure will be  taken. 15. Your abdomen will be measured to track your baby's growth. 16. The fetal heartbeat will be listened to starting around week 10 or 12 of your pregnancy. 17. Test results from any previous visits will be discussed. Your health care provider may ask you:  How you are feeling.  If you are feeling the baby move.  If you have had any abnormal symptoms, such as leaking fluid, bleeding, severe headaches, or abdominal cramping.  If you are using any tobacco products, including cigarettes, chewing tobacco, and electronic cigarettes.  If you have any questions. Other tests that may be performed during your first trimester include:  Blood tests to find your blood type and to check for the presence of any previous infections. They will also be used to check for low iron levels (anemia) and Rh antibodies. Later in the pregnancy, blood tests for diabetes will be done along with other tests if problems develop.  Urine tests to check for infections, diabetes, or protein in the urine.  An ultrasound to confirm the proper growth and development of the baby.  An amniocentesis to check for possible genetic problems.  Fetal screens for spina bifida and Down syndrome.  You may need other tests to make sure you and the baby are doing well.  HIV (human immunodeficiency virus) testing. Routine prenatal testing includes screening for HIV, unless you choose not to have this test. HOME CARE INSTRUCTIONS  Medicines  Follow your health care provider's instructions regarding medicine use. Specific medicines may be either safe or unsafe to take during pregnancy.  Take your prenatal vitamins as directed.  If you develop constipation, try taking a stool softener if your health care provider approves. Diet  Eat regular, well-balanced meals. Choose a variety of foods, such as meat or vegetable-based protein, fish, milk and low-fat dairy products, vegetables, fruits, and whole grain breads and cereals.  Your health care provider will help you determine the amount of weight gain that is right for you.  Avoid raw meat and uncooked cheese. These carry germs that can cause birth defects in the baby.  Eating four or five small meals rather than three large meals a day may help relieve nausea and vomiting. If you start to feel nauseous, eating a few soda crackers can be helpful. Drinking liquids between meals instead of during meals also seems to help nausea and vomiting.  If you develop constipation, eat more high-fiber foods, such as fresh vegetables or fruit and whole grains. Drink enough fluids to keep your urine clear or pale yellow. Activity and Exercise  Exercise only as directed by your health care provider. Exercising will help you:  Control your weight.  Stay in shape.  Be prepared for labor and delivery.  Experiencing pain or cramping in the lower abdomen or low back is a good sign that you should stop exercising. Check with your health care provider before continuing normal exercises.  Try  to avoid standing for long periods of time. Move your legs often if you must stand in one place for a long time.  Avoid heavy lifting.  Wear low-heeled shoes, and practice good posture.  You may continue to have sex unless your health care provider directs you otherwise. Relief of Pain or Discomfort  Wear a good support bra for breast tenderness.   Take warm sitz baths to soothe any pain or discomfort caused by hemorrhoids. Use hemorrhoid cream if your health care provider approves.   Rest with your legs elevated if you have leg cramps or low back pain.  If you develop varicose veins in your legs, wear support hose. Elevate your feet for 15 minutes, 3-4 times a day. Limit salt in your diet. Prenatal Care  Schedule your prenatal visits by the twelfth week of pregnancy. They are usually scheduled monthly at first, then more often in the last 2 months before delivery.  Write down your  questions. Take them to your prenatal visits.  Keep all your prenatal visits as directed by your health care provider. Safety  Wear your seat belt at all times when driving.  Make a list of emergency phone numbers, including numbers for family, friends, the hospital, and police and fire departments. General Tips  Ask your health care provider for a referral to a local prenatal education class. Begin classes no later than at the beginning of month 6 of your pregnancy.  Ask for help if you have counseling or nutritional needs during pregnancy. Your health care provider can offer advice or refer you to specialists for help with various needs.  Do not use hot tubs, steam rooms, or saunas.  Do not douche or use tampons or scented sanitary pads.  Do not cross your legs for long periods of time.  Avoid cat litter boxes and soil used by cats. These carry germs that can cause birth defects in the baby and possibly loss of the fetus by miscarriage or stillbirth.  Avoid all smoking, herbs, alcohol, and medicines not prescribed by your health care provider. Chemicals in these affect the formation and growth of the baby.  Do not use any tobacco products, including cigarettes, chewing tobacco, and electronic cigarettes. If you need help quitting, ask your health care provider. You may receive counseling support and other resources to help you quit.  Schedule a dentist appointment. At home, brush your teeth with a soft toothbrush and be gentle when you floss. SEEK MEDICAL CARE IF:   You have dizziness.  You have mild pelvic cramps, pelvic pressure, or nagging pain in the abdominal area.  You have persistent nausea, vomiting, or diarrhea.  You have a bad smelling vaginal discharge.  You have pain with urination.  You notice increased swelling in your face, hands, legs, or ankles. SEEK IMMEDIATE MEDICAL CARE IF:   You have a fever.  You are leaking fluid from your vagina.  You have  spotting or bleeding from your vagina.  You have severe abdominal cramping or pain.  You have rapid weight gain or loss.  You vomit blood or material that looks like coffee grounds.  You are exposed to [place] measles and have never had them.  You are exposed to fifth disease or chickenpox.  You develop a severe headache.  You have shortness of breath.  You have any kind of trauma, such as from a fall or a car accident.   This information is not intended to replace advice given to you by  your health care provider. Make sure you discuss any questions you have with your health care provider.   Document Released: 09/04/2001 Document Revised: 10/01/2014 Document Reviewed: 07/21/2013 Elsevier Interactive Patient Education [employer].

## 2016-03-21 ENCOUNTER — Ambulatory Visit (INDEPENDENT_AMBULATORY_CARE_PROVIDER_SITE_OTHER): Payer: BLUE CROSS/BLUE SHIELD | Admitting: Women's Health

## 2016-03-21 ENCOUNTER — Encounter: Payer: Self-pay | Admitting: Women's Health

## 2016-03-21 VITALS — BP 110/60 | HR 88 | Wt 148.0 lb

## 2016-03-21 DIAGNOSIS — Z36 Encounter for antenatal screening of mother: Secondary | ICD-10-CM | POA: Diagnosis not present

## 2016-03-21 DIAGNOSIS — Z3492 Encounter for supervision of normal pregnancy, unspecified, second trimester: Secondary | ICD-10-CM

## 2016-03-21 DIAGNOSIS — Z3682 Encounter for antenatal screening for nuchal translucency: Secondary | ICD-10-CM

## 2016-03-21 DIAGNOSIS — Z3482 Encounter for supervision of other normal pregnancy, second trimester: Secondary | ICD-10-CM

## 2016-03-21 DIAGNOSIS — O99512 Diseases of the respiratory system complicating pregnancy, second trimester: Secondary | ICD-10-CM

## 2016-03-21 DIAGNOSIS — Z3A16 16 weeks gestation of pregnancy: Secondary | ICD-10-CM

## 2016-03-21 DIAGNOSIS — Z331 Pregnant state, incidental: Secondary | ICD-10-CM

## 2016-03-21 DIAGNOSIS — Z363 Encounter for antenatal screening for malformations: Secondary | ICD-10-CM

## 2016-03-21 DIAGNOSIS — Z1389 Encounter for screening for other disorder: Secondary | ICD-10-CM

## 2016-03-21 LAB — POCT URINALYSIS DIPSTICK
Blood, UA: NEGATIVE
Glucose, UA: NEGATIVE
Ketones, UA: NEGATIVE
Leukocytes, UA: NEGATIVE
Nitrite, UA: NEGATIVE
Protein, UA: NEGATIVE

## 2016-03-21 NOTE — Progress Notes (Signed)
Low-risk OB appointment [id number] [redacted]w[redacted]d Estimated Date of Delivery: 09/05/16 BP 110/60 mmHg  Pulse 88  Wt 148 lb (67.132 kg)  BP, weight, and urine reviewed.  Refer to obstetrical flow sheet for FH & FHR.  No fm yet. Denies cramping, lof, vb, or uti s/s. Non-productive cough x few weeks, some congestion/allergies- to try claritin or zyrtec, can use robitussin or cough drops HRRR, LCTAB Reviewed warning s/s to report. Plan:  Continue routine obstetrical care  F/U in 3wks for OB appointment and anatomy u/s 2nd IT today

## 2016-03-21 NOTE — Patient Instructions (Signed)
Claritin or Zyrtec Cough drops, Robitusin  Second Trimester of Pregnancy The second trimester is from week 13 through week 28, months 4 through 6. The second trimester is often a time when you feel your best. Your body has also adjusted to being pregnant, and you begin to feel better physically. Usually, morning sickness has lessened or quit completely, you may have more energy, and you may have an increase in appetite. The second trimester is also a time when the fetus is growing rapidly. At the end of the sixth month, the fetus is about 9 inches long and weighs about 1 pounds. You will likely begin to feel the baby move (quickening) between 18 and 20 weeks of the pregnancy. BODY CHANGES Your body goes through many changes during pregnancy. The changes vary from woman to woman.   Your weight will continue to increase. You will notice your lower abdomen bulging out.  You may begin to get stretch marks on your hips, abdomen, and breasts.  You may develop headaches that can be relieved by medicines approved by your health care provider.  You may urinate more often because the fetus is pressing on your bladder.  You may develop or continue to have heartburn as a result of your pregnancy.  You may develop constipation because certain hormones are causing the muscles that push waste through your intestines to slow down.  You may develop hemorrhoids or swollen, bulging veins (varicose veins).  You may have back pain because of the weight gain and pregnancy hormones relaxing your joints between the bones in your pelvis and as a result of a shift in weight and the muscles that support your balance.  Your breasts will continue to grow and be tender.  Your gums may bleed and may be sensitive to brushing and flossing.  Dark spots or blotches (chloasma, mask of pregnancy) may develop on your face. This will likely fade after the baby is born.  A dark line from your belly button to the pubic area  (linea nigra) may appear. This will likely fade after the baby is born.  You may have changes in your hair. These can include thickening of your hair, rapid growth, and changes in texture. Some women also have hair loss during or after pregnancy, or hair that feels dry or thin. Your hair will most likely return to normal after your baby is born. WHAT TO EXPECT AT YOUR PRENATAL VISITS During a routine prenatal visit:  You will be weighed to make sure you and the fetus are growing normally.  Your blood pressure will be taken.  Your abdomen will be measured to track your baby's growth.  The fetal heartbeat will be listened to.  Any test results from the previous visit will be discussed. Your health care provider may ask you:  How you are feeling.  If you are feeling the baby move.  If you have had any abnormal symptoms, such as leaking fluid, bleeding, severe headaches, or abdominal cramping.  If you are using any tobacco products, including cigarettes, chewing tobacco, and electronic cigarettes.  If you have any questions. Other tests that may be performed during your second trimester include:  Blood tests that check for:  Low iron levels (anemia).  Gestational diabetes (between 24 and 28 weeks).  Rh antibodies.  Urine tests to check for infections, diabetes, or protein in the urine.  An ultrasound to confirm the proper growth and development of the baby.  An amniocentesis to check for possible genetic  problems.  Fetal screens for spina bifida and Down syndrome.  HIV (human immunodeficiency virus) testing. Routine prenatal testing includes screening for HIV, unless you choose not to have this test. HOME CARE INSTRUCTIONS   Avoid all smoking, herbs, alcohol, and unprescribed drugs. These chemicals affect the formation and growth of the baby.  Do not use any tobacco products, including cigarettes, chewing tobacco, and electronic cigarettes. If you need help quitting, ask  your health care provider. You may receive counseling support and other resources to help you quit.  Follow your health care provider's instructions regarding medicine use. There are medicines that are either safe or unsafe to take during pregnancy.  Exercise only as directed by your health care provider. Experiencing uterine cramps is a good sign to stop exercising.  Continue to eat regular, healthy meals.  Wear a good support bra for breast tenderness.  Do not use hot tubs, steam rooms, or saunas.  Wear your seat belt at all times when driving.  Avoid raw meat, uncooked cheese, cat litter boxes, and soil used by cats. These carry germs that can cause birth defects in the baby.  Take your prenatal vitamins.  Take 1500-2000 mg of calcium daily starting at the 20th week of pregnancy until you deliver your baby.  Try taking a stool softener (if your health care provider approves) if you develop constipation. Eat more high-fiber foods, such as fresh vegetables or fruit and whole grains. Drink plenty of fluids to keep your urine clear or pale yellow.  Take warm sitz baths to soothe any pain or discomfort caused by hemorrhoids. Use hemorrhoid cream if your health care provider approves.  If you develop varicose veins, wear support hose. Elevate your feet for 15 minutes, 3-4 times a day. Limit salt in your diet.  Avoid heavy lifting, wear low heel shoes, and practice good posture.  Rest with your legs elevated if you have leg cramps or low back pain.  Visit your dentist if you have not gone yet during your pregnancy. Use a soft toothbrush to brush your teeth and be gentle when you floss.  A sexual relationship may be continued unless your health care provider directs you otherwise.  Continue to go to all your prenatal visits as directed by your health care provider. SEEK MEDICAL CARE IF:   You have dizziness.  You have mild pelvic cramps, pelvic pressure, or nagging pain in the  abdominal area.  You have persistent nausea, vomiting, or diarrhea.  You have a bad smelling vaginal discharge.  You have pain with urination. SEEK IMMEDIATE MEDICAL CARE IF:   You have a fever.  You are leaking fluid from your vagina.  You have spotting or bleeding from your vagina.  You have severe abdominal cramping or pain.  You have rapid weight gain or loss.  You have shortness of breath with chest pain.  You notice sudden or extreme swelling of your face, hands, ankles, feet, or legs.  You have not felt your baby move in over an hour.  You have severe headaches that do not go away with medicine.  You have vision changes.   This information is not intended to replace advice given to you by your health care provider. Make sure you discuss any questions you have with your health care provider.   Document Released: 09/04/2001 Document Revised: 10/01/2014 Document Reviewed: 11/11/2012 Elsevier Interactive Patient Education [employer].

## 2016-03-29 ENCOUNTER — Telehealth: Payer: Self-pay | Admitting: *Deleted

## 2016-03-29 NOTE — Telephone Encounter (Signed)
-----   Message from Roma Schanz, [place] sent at 03/28/2016  1:52 PM EDT ----- Regarding: 2nd IT Hey Hasaan Radde, can you call [facility] and give them the needed info to complete this 2nd IT please.  Thanks! ----- Message -----    From: Lenis Noon, CMA    Sent: 03/21/2016   1:49 PM      To: Roma Schanz, CNM

## 2016-03-29 NOTE — Telephone Encounter (Signed)
Called [facility] and gave missing data for NTIT to be processed.

## 2016-03-30 LAB — MATERNAL SCREEN, INTEGRATED #1
Crown Rump Length: 59 mm
Gest. Age on Collection Date: 12.4 weeks
Maternal Age at EDD: 38 years
Nuchal Translucency (NT): 1.4 mm
Number of Fetuses: 1
PAPP-A Value: 662.2 ng/mL
Weight: 145 [lb_av]

## 2016-03-30 LAB — MATERNAL SCREEN, INTEGRATED #2
AFP MoM: 0.83
Alpha-Fetoprotein: 27.5 ng/mL
Crown Rump Length: 59 mm
DIA MoM: 0.56
DIA Value: 99.2 pg/mL
Estriol, Unconjugated: 1.23 ng/mL
Gest. Age on Collection Date: 12.4 weeks
Gestational Age: 16.4 weeks
Maternal Age at EDD: 38 years
Nuchal Translucency (NT): 1.4 mm
Nuchal Translucency MoM: 0.92
Number of Fetuses: 1
PAPP-A MoM: 0.68
PAPP-A Value: 662.2 ng/mL
Test Results:: NEGATIVE
Weight: 145 [lb_av]
Weight: 148 [lb_av]
hCG MoM: 0.57
hCG Value: 18.9 IU/mL
uE3 MoM: 1.38

## 2016-04-10 ENCOUNTER — Ambulatory Visit (INDEPENDENT_AMBULATORY_CARE_PROVIDER_SITE_OTHER): Payer: BLUE CROSS/BLUE SHIELD | Admitting: Women's Health

## 2016-04-10 ENCOUNTER — Encounter: Payer: Self-pay | Admitting: Women's Health

## 2016-04-10 ENCOUNTER — Ambulatory Visit (INDEPENDENT_AMBULATORY_CARE_PROVIDER_SITE_OTHER): Payer: BLUE CROSS/BLUE SHIELD

## 2016-04-10 VITALS — BP 100/62 | HR 90 | Wt 152.5 lb

## 2016-04-10 DIAGNOSIS — Z3492 Encounter for supervision of normal pregnancy, unspecified, second trimester: Secondary | ICD-10-CM

## 2016-04-10 DIAGNOSIS — Z1389 Encounter for screening for other disorder: Secondary | ICD-10-CM

## 2016-04-10 DIAGNOSIS — Z3A19 19 weeks gestation of pregnancy: Secondary | ICD-10-CM | POA: Diagnosis not present

## 2016-04-10 DIAGNOSIS — Z331 Pregnant state, incidental: Secondary | ICD-10-CM

## 2016-04-10 DIAGNOSIS — Z363 Encounter for antenatal screening for malformations: Secondary | ICD-10-CM

## 2016-04-10 DIAGNOSIS — O321XX1 Maternal care for breech presentation, fetus 1: Secondary | ICD-10-CM

## 2016-04-10 DIAGNOSIS — Z36 Encounter for antenatal screening of mother: Secondary | ICD-10-CM | POA: Diagnosis not present

## 2016-04-10 DIAGNOSIS — O09522 Supervision of elderly multigravida, second trimester: Secondary | ICD-10-CM

## 2016-04-10 LAB — POCT URINALYSIS DIPSTICK
Blood, UA: NEGATIVE
Glucose, UA: NEGATIVE
Ketones, UA: NEGATIVE
Leukocytes, UA: NEGATIVE
Nitrite, UA: NEGATIVE
Protein, UA: NEGATIVE

## 2016-04-10 NOTE — Patient Instructions (Signed)

## 2016-04-10 NOTE — Progress Notes (Signed)
[place] 18+6 wks,breech,bilat adnexa's wnl,unable to see ov's,left lat pl gr 0,cx 3.4 cm,svp of fluid 4.9 cm,fhr 141 bpm,efw 283 g,measurements c/w dates,anatomy complete,no obvious abnormalities seen

## 2016-04-10 NOTE — Progress Notes (Signed)
Low-risk OB appointment [id number] [redacted]w[redacted]d Estimated Date of Delivery: 09/05/16 BP 100/62 mmHg  Pulse 90  Wt 152 lb 8 oz (69.174 kg)  BP, weight, and urine reviewed.  Refer to obstetrical flow sheet for FH & FHR.  Reports no fm yet.  Denies regular uc's, lof, vb, or uti s/s. No complaints. Reviewed today's normal anatomy u/s, warning s/s to report. Plan:  Continue routine obstetrical care  F/U in 4wks for OB appointment

## 2016-04-11 ENCOUNTER — Other Ambulatory Visit: Payer: BLUE CROSS/BLUE SHIELD

## 2016-05-08 ENCOUNTER — Ambulatory Visit (INDEPENDENT_AMBULATORY_CARE_PROVIDER_SITE_OTHER): Payer: BLUE CROSS/BLUE SHIELD | Admitting: Women's Health

## 2016-05-08 ENCOUNTER — Encounter: Payer: Self-pay | Admitting: Women's Health

## 2016-05-08 VITALS — BP 122/60 | HR 96 | Wt 160.0 lb

## 2016-05-08 DIAGNOSIS — Z331 Pregnant state, incidental: Secondary | ICD-10-CM

## 2016-05-08 DIAGNOSIS — O09522 Supervision of elderly multigravida, second trimester: Secondary | ICD-10-CM

## 2016-05-08 DIAGNOSIS — Z1389 Encounter for screening for other disorder: Secondary | ICD-10-CM

## 2016-05-08 DIAGNOSIS — Z3492 Encounter for supervision of normal pregnancy, unspecified, second trimester: Secondary | ICD-10-CM

## 2016-05-08 LAB — POCT URINALYSIS DIPSTICK
Blood, UA: NEGATIVE
Glucose, UA: NEGATIVE
Ketones, UA: NEGATIVE
Leukocytes, UA: NEGATIVE
Nitrite, UA: NEGATIVE
Protein, UA: NEGATIVE

## 2016-05-08 NOTE — Progress Notes (Signed)
Low-risk OB appointment [id number] [redacted]w[redacted]d Estimated Date of Delivery: 09/05/16 BP 122/60   Pulse 96   Wt 160 lb (72.6 kg)   BMI 25.82 kg/m   BP, weight, and urine reviewed.  Refer to obstetrical flow sheet for FH & FHR.  Reports good fm.  Denies regular uc's, lof, vb, or uti s/s. Constant lower abd and lower back pain- discussed getting maternity belt.  Reviewed ptl s/s, fm. Plan:  Continue routine obstetrical care  F/U in 4wks for OB appointment and pn2

## 2016-05-08 NOTE — Patient Instructions (Signed)
You will have your sugar test next visit.  Please do not eat or drink anything after midnight the night before you come, not even water.  You will be here for at least two hours.     Call the office [phone]) or go to [facility] if:  You begin to have strong, frequent contractions  Your water breaks.  Sometimes it is a big gush of fluid, sometimes it is just a trickle that keeps getting your panties wet or running down your legs  You have vaginal bleeding.  It is normal to have a small amount of spotting if your cervix was checked.   You don't feel your baby moving like normal.  If you don't, get you something to eat and drink and lay down and focus on feeling your baby move.   If your baby is still not moving like normal, you should call the office or go to [facility] P[facility] The second trimester is from week 13 through week 28, months 4 through 6. The second trimester is often a time when you feel your best. Your body has also adjusted to being pregnant, and you begin to feel better physically. Usually, morning sickness has lessened or quit completely, you may have more energy, and you may have an increase in appetite. The second trimester is also a time when the fetus is growing rapidly. At the end of the sixth month, the fetus is about 9 inches long and weighs about 1 pounds. You will likely begin to feel the baby move (quickening) between 18 and 20 weeks of the pregnancy. BODY CHANGES Your body goes through many changes during pregnancy. The changes vary from woman to woman.   Your weight will continue to increase. You will notice your lower abdomen bulging out.  You may begin to get stretch marks on your hips, abdomen, and breasts.  You may develop headaches that can be relieved by medicines approved by your health care provider.  You may urinate more often because the fetus is pressing on your bladder.  You may develop or continue to have  heartburn as a result of your pregnancy.  You may develop constipation because certain hormones are causing the muscles that push waste through your intestines to slow down.  You may develop hemorrhoids or swollen, bulging veins (varicose veins).  You may have back pain because of the weight gain and pregnancy hormones relaxing your joints between the bones in your pelvis and as a result of a shift in weight and the muscles that support your balance.  Your breasts will continue to grow and be tender.  Your gums may bleed and may be sensitive to brushing and flossing.  Dark spots or blotches (chloasma, mask of pregnancy) may develop on your face. This will likely fade after the baby is born.  A dark line from your belly button to the pubic area (linea nigra) may appear. This will likely fade after the baby is born.  You may have changes in your hair. These can include thickening of your hair, rapid growth, and changes in texture. Some women also have hair loss during or after pregnancy, or hair that feels dry or thin. Your hair will most likely return to normal after your baby is born. WHAT TO EXPECT AT YOUR PRENATAL VISITS During a routine prenatal visit:  You will be weighed to make sure you and the fetus are growing normally.  Your blood pressure will be taken.  Your abdomen will be measured to track your baby's growth.  The fetal heartbeat will be listened to.  Any test results from the previous visit will be discussed. Your health care provider may ask you:  How you are feeling.  If you are feeling the baby move.  If you have had any abnormal symptoms, such as leaking fluid, bleeding, severe headaches, or abdominal cramping.  If you have any questions. Other tests that may be performed during your second trimester include:  Blood tests that check for:  Low iron levels (anemia).  Gestational diabetes (between 24 and 28 weeks).  Rh antibodies.  Urine tests to check  for infections, diabetes, or protein in the urine.  An ultrasound to confirm the proper growth and development of the baby.  An amniocentesis to check for possible genetic problems.  Fetal screens for spina bifida and Down syndrome. HOME CARE INSTRUCTIONS   Avoid all smoking, herbs, alcohol, and unprescribed drugs. These chemicals affect the formation and growth of the baby.  Follow your health care provider's instructions regarding medicine use. There are medicines that are either safe or unsafe to take during pregnancy.  Exercise only as directed by your health care provider. Experiencing uterine cramps is a good sign to stop exercising.  Continue to eat regular, healthy meals.  Wear a good support bra for breast tenderness.  Do not use hot tubs, steam rooms, or saunas.  Wear your seat belt at all times when driving.  Avoid raw meat, uncooked cheese, cat litter boxes, and soil used by cats. These carry germs that can cause birth defects in the baby.  Take your prenatal vitamins.  Try taking a stool softener (if your health care provider approves) if you develop constipation. Eat more high-fiber foods, such as fresh vegetables or fruit and whole grains. Drink plenty of fluids to keep your urine clear or pale yellow.  Take warm sitz baths to soothe any pain or discomfort caused by hemorrhoids. Use hemorrhoid cream if your health care provider approves.  If you develop varicose veins, wear support hose. Elevate your feet for 15 minutes, 3-4 times a day. Limit salt in your diet.  Avoid heavy lifting, wear low heel shoes, and practice good posture.  Rest with your legs elevated if you have leg cramps or low back pain.  Visit your dentist if you have not gone yet during your pregnancy. Use a soft toothbrush to brush your teeth and be gentle when you floss.  A sexual relationship may be continued unless your health care provider directs you otherwise.  Continue to go to all your  prenatal visits as directed by your health care provider. SEEK MEDICAL CARE IF:   You have dizziness.  You have mild pelvic cramps, pelvic pressure, or nagging pain in the abdominal area.  You have persistent nausea, vomiting, or diarrhea.  You have a bad smelling vaginal discharge.  You have pain with urination. SEEK IMMEDIATE MEDICAL CARE IF:   You have a fever.  You are leaking fluid from your vagina.  You have spotting or bleeding from your vagina.  You have severe abdominal cramping or pain.  You have rapid weight gain or loss.  You have shortness of breath with chest pain.  You notice sudden or extreme swelling of your face, hands, ankles, feet, or legs.  You have not felt your baby move in over an hour.  You have severe headaches that do not go away with medicine.  You have vision changes.  Document Released: 09/04/2001 Document Revised: 09/15/2013 Document Reviewed: 11/11/2012 Exit[employer] Patient Information 2015 [employer]. This information is not intended to replace advice given to you by your health care provider. Make sure you discuss any questions you have with your health care provider.     

## 2016-05-10 ENCOUNTER — Telehealth: Payer: Self-pay | Admitting: Advanced Practice Midwife

## 2016-05-10 NOTE — Telephone Encounter (Signed)
She may have a note that reduces her hours to 6/day and frequent bathroom breaks

## 2016-05-10 NOTE — Telephone Encounter (Signed)
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 Pt requesting to be able to work 6 hours/days due to frequent restroom breaks and stairs. Pt states last two nights when she completed her shift had back pain and bilateral feet pain.  Please advise.

## 2016-05-11 NOTE — Telephone Encounter (Signed)
Work Note completed and left at front desk for pt to pick up.

## 2016-06-05 ENCOUNTER — Ambulatory Visit (INDEPENDENT_AMBULATORY_CARE_PROVIDER_SITE_OTHER): Payer: BLUE CROSS/BLUE SHIELD | Admitting: Advanced Practice Midwife

## 2016-06-05 ENCOUNTER — Encounter: Payer: Self-pay | Admitting: Advanced Practice Midwife

## 2016-06-05 ENCOUNTER — Other Ambulatory Visit: Payer: BLUE CROSS/BLUE SHIELD

## 2016-06-05 VITALS — BP 112/50 | HR 88 | Wt 165.4 lb

## 2016-06-05 DIAGNOSIS — Z331 Pregnant state, incidental: Secondary | ICD-10-CM

## 2016-06-05 DIAGNOSIS — Z369 Encounter for antenatal screening, unspecified: Secondary | ICD-10-CM

## 2016-06-05 DIAGNOSIS — O09522 Supervision of elderly multigravida, second trimester: Secondary | ICD-10-CM

## 2016-06-05 DIAGNOSIS — Z131 Encounter for screening for diabetes mellitus: Secondary | ICD-10-CM | POA: Diagnosis not present

## 2016-06-05 DIAGNOSIS — Z3482 Encounter for supervision of other normal pregnancy, second trimester: Secondary | ICD-10-CM

## 2016-06-05 DIAGNOSIS — Z3A27 27 weeks gestation of pregnancy: Secondary | ICD-10-CM

## 2016-06-05 DIAGNOSIS — Z3492 Encounter for supervision of normal pregnancy, unspecified, second trimester: Secondary | ICD-10-CM

## 2016-06-05 DIAGNOSIS — Z1389 Encounter for screening for other disorder: Secondary | ICD-10-CM

## 2016-06-05 DIAGNOSIS — Z36 Encounter for antenatal screening of mother: Secondary | ICD-10-CM | POA: Diagnosis not present

## 2016-06-05 LAB — POCT URINALYSIS DIPSTICK
Blood, UA: NEGATIVE
Glucose, UA: NEGATIVE
Ketones, UA: NEGATIVE
Leukocytes, UA: NEGATIVE
Nitrite, UA: NEGATIVE
Protein, UA: NEGATIVE

## 2016-06-05 NOTE — Patient Instructions (Signed)

## 2016-06-05 NOTE — Progress Notes (Signed)
G2P0010 [redacted]w[redacted]d Estimated Date of Delivery: 09/05/16  Blood pressure (!) 112/50, pulse 88, weight 165 lb 6.4 oz (75 kg).   BP weight and urine results all reviewed and noted.  Please refer to the obstetrical flow sheet for the fundal height and fetal heart rate documentation:  Patient reports good fetal movement, denies any bleeding and no rupture of membranes symptoms or regular contractions. Patient is without complaints. All questions were answered.  Orders Placed This Encounter  Procedures  . POCT urinalysis dipstick    Plan:  Continued routine obstetrical care, PN2 today  Return in about 3 weeks (around 06/26/2016) for LROB.

## 2016-06-06 LAB — CBC
Hematocrit: 32.6 % — ABNORMAL LOW (ref 34.0–46.6)
Hemoglobin: 11.4 g/dL (ref 11.1–15.9)
MCH: 31.1 pg (ref 26.6–33.0)
MCHC: 35 g/dL (ref 31.5–35.7)
MCV: 89 fL (ref 79–97)
Platelets: 219 10*3/uL (ref 150–379)
RBC: 3.66 x10E6/uL — ABNORMAL LOW (ref 3.77–5.28)
RDW: 12.4 % (ref 12.3–15.4)
WBC: 9.6 10*3/uL (ref 3.4–10.8)

## 2016-06-06 LAB — GLUCOSE TOLERANCE, 2 HOURS W/ 1HR
Glucose, 1 hour: 95 mg/dL (ref 65–179)
Glucose, 2 hour: 93 mg/dL (ref 65–152)
Glucose, Fasting: 74 mg/dL (ref 65–91)

## 2016-06-06 LAB — HIV ANTIBODY (ROUTINE TESTING W REFLEX): HIV Screen 4th Generation wRfx: NONREACTIVE

## 2016-06-06 LAB — RPR: RPR Ser Ql: NONREACTIVE

## 2016-06-06 LAB — ANTIBODY SCREEN: Antibody Screen: NEGATIVE

## 2016-06-26 ENCOUNTER — Ambulatory Visit (INDEPENDENT_AMBULATORY_CARE_PROVIDER_SITE_OTHER): Payer: BLUE CROSS/BLUE SHIELD | Admitting: Women's Health

## 2016-06-26 ENCOUNTER — Encounter: Payer: Self-pay | Admitting: Women's Health

## 2016-06-26 VITALS — BP 118/76 | HR 80 | Wt 170.0 lb

## 2016-06-26 DIAGNOSIS — Z331 Pregnant state, incidental: Secondary | ICD-10-CM

## 2016-06-26 DIAGNOSIS — Z23 Encounter for immunization: Secondary | ICD-10-CM | POA: Diagnosis not present

## 2016-06-26 DIAGNOSIS — Z3483 Encounter for supervision of other normal pregnancy, third trimester: Secondary | ICD-10-CM

## 2016-06-26 DIAGNOSIS — O26843 Uterine size-date discrepancy, third trimester: Secondary | ICD-10-CM

## 2016-06-26 DIAGNOSIS — Z6791 Unspecified blood type, Rh negative: Secondary | ICD-10-CM | POA: Insufficient documentation

## 2016-06-26 DIAGNOSIS — Z3A30 30 weeks gestation of pregnancy: Secondary | ICD-10-CM

## 2016-06-26 DIAGNOSIS — O26899 Other specified pregnancy related conditions, unspecified trimester: Secondary | ICD-10-CM

## 2016-06-26 DIAGNOSIS — Z1389 Encounter for screening for other disorder: Secondary | ICD-10-CM

## 2016-06-26 DIAGNOSIS — O09899 Supervision of other high risk pregnancies, unspecified trimester: Secondary | ICD-10-CM

## 2016-06-26 DIAGNOSIS — O360131 Maternal care for anti-D [Rh] antibodies, third trimester, fetus 1: Secondary | ICD-10-CM | POA: Diagnosis not present

## 2016-06-26 LAB — POCT URINALYSIS DIPSTICK
Blood, UA: NEGATIVE
Glucose, UA: NEGATIVE
Ketones, UA: NEGATIVE
Leukocytes, UA: NEGATIVE
Nitrite, UA: NEGATIVE
Protein, UA: NEGATIVE

## 2016-06-26 MED ORDER — RHO D IMMUNE GLOBULIN 1500 UNIT/2ML IJ SOSY
300.0000 ug | PREFILLED_SYRINGE | Freq: Once | INTRAMUSCULAR | Status: AC
  Administered 2016-06-26: 300 ug via INTRAMUSCULAR

## 2016-06-26 NOTE — Progress Notes (Signed)
Low-risk OB appointment G2P0010 [redacted]w[redacted]d Estimated Date of Delivery: 09/05/16 There were no vitals taken for this visit.  BP, weight, and urine reviewed.  Refer to obstetrical flow sheet for FH & FHR.  Reports good fm.  Denies regular uc's, vb, or uti s/s. Soreness RLQ- feels like baby kicking/moving. For past 2wks has had wet spot in underwear q 1hr. Denies gush/leaking down legs, etc. Wants in-hospital BTL, discussed risks/benefits, consent signed today.  SSE: cx visually closed, no pooling, no change w/ valsalva, fern and nitrazine neg Reviewed normal pn2 results, ptl s/s, fkc. Recommended Tdap at HD/PCP per CDC guidelines.  Plan:  Continue routine obstetrical care  F/U in asap for efw/afi u/s for s<d, then 2wks for OB appointment  Flu shot today, Rhogam today

## 2016-06-26 NOTE — Patient Instructions (Addendum)
Call the office [phone]) or go to [facility] if:  You begin to have strong, frequent contractions  Your water breaks.  Sometimes it is a big gush of fluid, sometimes it is just a trickle that keeps getting your panties wet or running down your legs  You have vaginal bleeding.  It is normal to have a small amount of spotting if your cervix was checked.   You don't feel your baby moving like normal.  If you don't, get you something to eat and drink and lay down and focus on feeling your baby move.  You should feel at least 10 movements in 2 hours.  If you don't, you should call the office or go to [facility].    Tdap Vaccine  It is recommended that you get the Tdap vaccine during the third trimester of EACH pregnancy to help protect your baby from getting pertussis (whooping cough)  27-36 weeks is the BEST time to do this so that you can pass the protection on to your baby. During pregnancy is better than after pregnancy, but if you are unable to get it during pregnancy it will be offered at the hospital.   You can get this vaccine at the health department or your family doctor  Everyone who will be around your baby should also be up-to-date on their vaccines. Adults (who are not pregnant) only need 1 dose of Tdap during adulthood.   [place] Pediatricians/Family Doctors:  [facility] [place] [phone]                 [facility] [phone] (usually not accepting new patients unless you have family there already, you are always welcome to call and ask)            [facility] ([place]) [phone]   [facility] Pediatricians/Family Doctors:   [facility]: [phone]  [facility]: [phone]  Third Trimester of Pregnancy The third trimester is from week 29 through week 42, months 7 through 9. The third trimester is a time when the fetus  is growing rapidly. At the end of the ninth month, the fetus is about 20 inches in length and weighs 6-10 pounds.  BODY CHANGES Your body goes through many changes during pregnancy. The changes vary from woman to woman.   Your weight will continue to increase. You can expect to gain 25-35 pounds (11-16 kg) by the end of the pregnancy.  You may begin to get stretch marks on your hips, abdomen, and breasts.  You may urinate more often because the fetus is moving lower into your pelvis and pressing on your bladder.  You may develop or continue to have heartburn as a result of your pregnancy.  You may develop constipation because certain hormones are causing the muscles that push waste through your intestines to slow down.  You may develop hemorrhoids or swollen, bulging veins (varicose veins).  You may have pelvic pain because of the weight gain and pregnancy hormones relaxing your joints between the bones in your pelvis. Backaches may result from overexertion of the muscles supporting your posture.  You may have changes in your hair. These can include thickening of your hair, rapid growth, and changes in texture. Some women also have hair loss during or after pregnancy, or hair that feels dry or thin. Your hair will most likely return to normal after your baby  is born.  Your breasts will continue to grow and be tender. A yellow discharge may leak from your breasts called colostrum.  Your belly button may stick out.  You may feel short of breath because of your expanding uterus.  You may notice the fetus "dropping," or moving lower in your abdomen.  You may have a bloody mucus discharge. This usually occurs a few days to a week before labor begins.  Your cervix becomes thin and soft (effaced) near your due date. WHAT TO EXPECT AT YOUR PRENATAL EXAMS  You will have prenatal exams every 2 weeks until week 36. Then, you will have weekly prenatal exams. During a routine prenatal  visit:  You will be weighed to make sure you and the fetus are growing normally.  Your blood pressure is taken.  Your abdomen will be measured to track your baby's growth.  The fetal heartbeat will be listened to.  Any test results from the previous visit will be discussed.  You may have a cervical check near your due date to see if you have effaced. At around 36 weeks, your caregiver will check your cervix. At the same time, your caregiver will also perform a test on the secretions of the vaginal tissue. This test is to determine if a type of bacteria, Group B streptococcus, is present. Your caregiver will explain this further. Your caregiver may ask you:  What your birth plan is.  How you are feeling.  If you are feeling the baby move.  If you have had any abnormal symptoms, such as leaking fluid, bleeding, severe headaches, or abdominal cramping.  If you are using any tobacco products, including cigarettes, chewing tobacco, and electronic cigarettes.  If you have any questions. Other tests or screenings that may be performed during your third trimester include:  Blood tests that check for low iron levels (anemia).  Fetal testing to check the health, activity level, and growth of the fetus. Testing is done if you have certain medical conditions or if there are problems during the pregnancy.  HIV (human immunodeficiency virus) testing. If you are at high risk, you may be screened for HIV during your third trimester of pregnancy. FALSE LABOR You may feel small, irregular contractions that eventually go away. These are called Braxton Hicks contractions, or false labor. Contractions may last for hours, days, or even weeks before true labor sets in. If contractions come at regular intervals, intensify, or become painful, it is best to be seen by your caregiver.  SIGNS OF LABOR   Menstrual-like cramps.  Contractions that are 5 minutes apart or less.  Contractions that start on  the top of the uterus and spread down to the lower abdomen and back.  A sense of increased pelvic pressure or back pain.  A watery or bloody mucus discharge that comes from the vagina. If you have any of these signs before the 37th week of pregnancy, call your caregiver right away. You need to go to the hospital to get checked immediately. HOME CARE INSTRUCTIONS   Avoid all smoking, herbs, alcohol, and unprescribed drugs. These chemicals affect the formation and growth of the baby.  Do not use any tobacco products, including cigarettes, chewing tobacco, and electronic cigarettes. If you need help quitting, ask your health care provider. You may receive counseling support and other resources to help you quit.  Follow your caregiver's instructions regarding medicine use. There are medicines that are either safe or unsafe to take during pregnancy.  Exercise  only as directed by your caregiver. Experiencing uterine cramps is a good sign to stop exercising.  Continue to eat regular, healthy meals.  Wear a good support bra for breast tenderness.  Do not use hot tubs, steam rooms, or saunas.  Wear your seat belt at all times when driving.  Avoid raw meat, uncooked cheese, cat litter boxes, and soil used by cats. These carry germs that can cause birth defects in the baby.  Take your prenatal vitamins.  Take 1500-2000 mg of calcium daily starting at the 20th week of pregnancy until you deliver your baby.  Try taking a stool softener (if your caregiver approves) if you develop constipation. Eat more high-fiber foods, such as fresh vegetables or fruit and whole grains. Drink plenty of fluids to keep your urine clear or pale yellow.  Take warm sitz baths to soothe any pain or discomfort caused by hemorrhoids. Use hemorrhoid cream if your caregiver approves.  If you develop varicose veins, wear support hose. Elevate your feet for 15 minutes, 3-4 times a day. Limit salt in your diet.  Avoid  heavy lifting, wear low heal shoes, and practice good posture.  Rest a lot with your legs elevated if you have leg cramps or low back pain.  Visit your dentist if you have not gone during your pregnancy. Use a soft toothbrush to brush your teeth and be gentle when you floss.  A sexual relationship may be continued unless your caregiver directs you otherwise.  Do not travel far distances unless it is absolutely necessary and only with the approval of your caregiver.  Take prenatal classes to understand, practice, and ask questions about the labor and delivery.  Make a trial run to the hospital.  Pack your hospital bag.  Prepare the baby's nursery.  Continue to go to all your prenatal visits as directed by your caregiver. SEEK MEDICAL CARE IF:  You are unsure if you are in labor or if your water has broken.  You have dizziness.  You have mild pelvic cramps, pelvic pressure, or nagging pain in your abdominal area.  You have persistent nausea, vomiting, or diarrhea.  You have a bad smelling vaginal discharge.  You have pain with urination. SEEK IMMEDIATE MEDICAL CARE IF:   You have a fever.  You are leaking fluid from your vagina.  You have spotting or bleeding from your vagina.  You have severe abdominal cramping or pain.  You have rapid weight loss or gain.  You have shortness of breath with chest pain.  You notice sudden or extreme swelling of your face, hands, ankles, feet, or legs.  You have not felt your baby move in over an hour.  You have severe headaches that do not go away with medicine.  You have vision changes.   This information is not intended to replace advice given to you by your health care provider. Make sure you discuss any questions you have with your health care provider.   Document Released: 09/04/2001 Document Revised: 10/01/2014 Document Reviewed: 11/11/2012 Elsevier Interactive Patient Education [employer].

## 2016-06-27 ENCOUNTER — Ambulatory Visit (INDEPENDENT_AMBULATORY_CARE_PROVIDER_SITE_OTHER): Payer: BLUE CROSS/BLUE SHIELD

## 2016-06-27 ENCOUNTER — Encounter: Payer: Self-pay | Admitting: Women's Health

## 2016-06-27 DIAGNOSIS — O09523 Supervision of elderly multigravida, third trimester: Secondary | ICD-10-CM

## 2016-06-27 DIAGNOSIS — Z3A30 30 weeks gestation of pregnancy: Secondary | ICD-10-CM | POA: Diagnosis not present

## 2016-06-27 DIAGNOSIS — O26843 Uterine size-date discrepancy, third trimester: Secondary | ICD-10-CM

## 2016-06-27 NOTE — Progress Notes (Signed)
[place] 30 wks,cephalic,left lat pl gr 0,cx 3.8 cm,normal ov's bilat,afi 15 cm,fhr 124 bpm,efw 1553 55%

## 2016-07-10 ENCOUNTER — Ambulatory Visit (INDEPENDENT_AMBULATORY_CARE_PROVIDER_SITE_OTHER): Payer: BLUE CROSS/BLUE SHIELD | Admitting: Obstetrics & Gynecology

## 2016-07-10 ENCOUNTER — Encounter: Payer: Self-pay | Admitting: Obstetrics & Gynecology

## 2016-07-10 VITALS — BP 100/60 | HR 92 | Wt 171.0 lb

## 2016-07-10 DIAGNOSIS — Z3483 Encounter for supervision of other normal pregnancy, third trimester: Secondary | ICD-10-CM

## 2016-07-10 DIAGNOSIS — Z1389 Encounter for screening for other disorder: Secondary | ICD-10-CM

## 2016-07-10 DIAGNOSIS — Z331 Pregnant state, incidental: Secondary | ICD-10-CM

## 2016-07-10 LAB — POCT URINALYSIS DIPSTICK
Blood, UA: NEGATIVE
Glucose, UA: NEGATIVE
Ketones, UA: NEGATIVE
Leukocytes, UA: NEGATIVE
Nitrite, UA: NEGATIVE
Protein, UA: NEGATIVE

## 2016-07-10 NOTE — Progress Notes (Signed)
G2P0010 [redacted]w[redacted]d Estimated Date of Delivery: 09/05/16  Blood pressure 100/60, pulse 92, weight 171 lb (77.6 kg).   BP weight and urine results all reviewed and noted.  Please refer to the obstetrical flow sheet for the fundal height and fetal heart rate documentation:  Patient reports good fetal movement, denies any bleeding and no rupture of membranes symptoms or regular contractions. Patient is without complaints. All questions were answered.  Orders Placed This Encounter  Procedures  . POCT urinalysis dipstick    Plan:  Continued routine obstetrical care,   Return in about 2 weeks (around 07/24/2016) for LROB.

## 2016-07-16 ENCOUNTER — Telehealth: Payer: Self-pay | Admitting: *Deleted

## 2016-07-16 NOTE — Telephone Encounter (Signed)
Pt c/o nausea and vomiting x 1 day, pt states does have Diclegis she can take. Pt advised to take the Diclegis as prescribed if no improvement call our office back. Pt verbalized understanding.

## 2016-07-24 ENCOUNTER — Encounter: Payer: Self-pay | Admitting: Women's Health

## 2016-07-24 ENCOUNTER — Ambulatory Visit (INDEPENDENT_AMBULATORY_CARE_PROVIDER_SITE_OTHER): Payer: BLUE CROSS/BLUE SHIELD | Admitting: Women's Health

## 2016-07-24 VITALS — BP 100/70 | HR 92 | Wt 173.0 lb

## 2016-07-24 DIAGNOSIS — Z3483 Encounter for supervision of other normal pregnancy, third trimester: Secondary | ICD-10-CM

## 2016-07-24 DIAGNOSIS — O26843 Uterine size-date discrepancy, third trimester: Secondary | ICD-10-CM

## 2016-07-24 DIAGNOSIS — Z331 Pregnant state, incidental: Secondary | ICD-10-CM

## 2016-07-24 DIAGNOSIS — Z1389 Encounter for screening for other disorder: Secondary | ICD-10-CM

## 2016-07-24 DIAGNOSIS — Z3A34 34 weeks gestation of pregnancy: Secondary | ICD-10-CM

## 2016-07-24 LAB — POCT URINALYSIS DIPSTICK
Blood, UA: NEGATIVE
Glucose, UA: NEGATIVE
Ketones, UA: NEGATIVE
Leukocytes, UA: NEGATIVE
Nitrite, UA: NEGATIVE

## 2016-07-24 NOTE — Progress Notes (Signed)
Low-risk OB appointment G2P0010 107w6d Estimated Date of Delivery: 09/05/16 BP 100/70   Pulse 92   Wt 173 lb (78.5 kg)   BMI 27.92 kg/m   BP, weight, and urine reviewed.  Refer to obstetrical flow sheet for FH & FHR.  Reports good fm.  Denies regular uc's, lof, vb, or uti s/s. No complaints. Reviewed ptl s/s, fkc. Plan:  Continue routine obstetrical care  F/U in 1wk for OB appointment and efw/afi for s<d

## 2016-07-24 NOTE — Patient Instructions (Signed)
Call the office ([phone]) or go to [facility] if:  You begin to have strong, frequent contractions  Your water breaks.  Sometimes it is a big gush of fluid, sometimes it is just a trickle that keeps getting your panties wet or running down your legs  You have vaginal bleeding.  It is normal to have a small amount of spotting if your cervix was checked.   You don't feel your baby moving like normal.  If you don't, get you something to eat and drink and lay down and focus on feeling your baby move.  You should feel at least 10 movements in 2 hours.  If you don't, you should call the office or go to [facility].    Preterm Labor Information Preterm labor is when labor starts at less than 37 weeks of pregnancy. The normal length of a pregnancy is 39 to 41 weeks. CAUSES Often, there is no identifiable underlying cause as to why a woman goes into preterm labor. One of the most common known causes of preterm labor is infection. Infections of the uterus, cervix, vagina, amniotic sac, bladder, kidney, or even the lungs (pneumonia) can cause labor to start. Other suspected causes of preterm labor include:   Urogenital infections, such as yeast infections and bacterial vaginosis.   Uterine abnormalities (uterine shape, uterine septum, fibroids, or bleeding from the placenta).   A cervix that has been operated on (it may fail to stay closed).   Malformations in the fetus.   Multiple gestations (twins, triplets, and so on).   Breakage of the amniotic sac.  RISK FACTORS  Having a previous history of preterm labor.   Having premature rupture of membranes (PROM).   Having a placenta that covers the opening of the cervix (placenta previa).   Having a placenta that separates from the uterus (placental abruption).   Having a cervix that is too weak to hold the fetus in the uterus (incompetent cervix).   Having too much fluid in the amniotic sac (polyhydramnios).   Taking  illegal drugs or smoking while pregnant.   Not gaining enough weight while pregnant.   Being younger than 18 and older than 38 years old.   Having a low socioeconomic status.   Being African American. SYMPTOMS Signs and symptoms of preterm labor include:   Menstrual-like cramps, abdominal pain, or back pain.  Uterine contractions that are regular, as frequent as six in an hour, regardless of their intensity (may be mild or painful).  Contractions that start on the top of the uterus and spread down to the lower abdomen and back.   A sense of increased pelvic pressure.   A watery or bloody mucus discharge that comes from the vagina.  TREATMENT Depending on the length of the pregnancy and other circumstances, your health care provider may suggest bed rest. If necessary, there are medicines that can be given to stop contractions and to mature the fetal lungs. If labor happens before 34 weeks of pregnancy, a prolonged hospital stay may be recommended. Treatment depends on the condition of both you and the fetus.  WHAT SHOULD YOU DO IF YOU THINK YOU ARE IN PRETERM LABOR? Call your health care provider right away. You will need to go to the hospital to get checked immediately. HOW CAN YOU PREVENT PRETERM LABOR IN FUTURE PREGNANCIES? You should:   Stop smoking if you smoke.  Maintain healthy weight gain and avoid chemicals and drugs that are not necessary.  Be watchful for   any type of infection.  Inform your health care provider if you have a known history of preterm labor.   This information is not intended to replace advice given to you by your health care provider. Make sure you discuss any questions you have with your health care provider.   Document Released: 12/01/2003 Document Revised: 05/13/2013 Document Reviewed: 10/13/2012 Elsevier Interactive Patient Education 2016 Elsevier Inc.  

## 2016-07-31 ENCOUNTER — Ambulatory Visit (INDEPENDENT_AMBULATORY_CARE_PROVIDER_SITE_OTHER): Payer: BLUE CROSS/BLUE SHIELD | Admitting: Obstetrics & Gynecology

## 2016-07-31 ENCOUNTER — Ambulatory Visit (INDEPENDENT_AMBULATORY_CARE_PROVIDER_SITE_OTHER): Payer: BLUE CROSS/BLUE SHIELD

## 2016-07-31 VITALS — BP 94/60 | HR 88 | Wt 171.5 lb

## 2016-07-31 DIAGNOSIS — Z3A35 35 weeks gestation of pregnancy: Secondary | ICD-10-CM | POA: Diagnosis not present

## 2016-07-31 DIAGNOSIS — O26843 Uterine size-date discrepancy, third trimester: Secondary | ICD-10-CM | POA: Diagnosis not present

## 2016-07-31 DIAGNOSIS — Z3483 Encounter for supervision of other normal pregnancy, third trimester: Secondary | ICD-10-CM

## 2016-07-31 DIAGNOSIS — Z331 Pregnant state, incidental: Secondary | ICD-10-CM

## 2016-07-31 DIAGNOSIS — O09523 Supervision of elderly multigravida, third trimester: Secondary | ICD-10-CM

## 2016-07-31 DIAGNOSIS — Z1389 Encounter for screening for other disorder: Secondary | ICD-10-CM

## 2016-07-31 LAB — POCT URINALYSIS DIPSTICK
Blood, UA: NEGATIVE
Glucose, UA: NEGATIVE
Ketones, UA: NEGATIVE
Leukocytes, UA: NEGATIVE
Nitrite, UA: NEGATIVE

## 2016-07-31 NOTE — Progress Notes (Signed)
G2P0010 [redacted]w[redacted]d Estimated Date of Delivery: 09/05/16  Blood pressure 94/60, pulse 88, weight 171 lb 8 oz (77.8 kg).   BP weight and urine results all reviewed and noted.  Please refer to the obstetrical flow sheet for the fundal height and fetal heart rate documentation:  Patient reports good fetal movement, denies any bleeding and no rupture of membranes symptoms or regular contractions. Patient is without complaints. All questions were answered.  Orders Placed This Encounter  Procedures  . POCT urinalysis dipstick    Plan:  Continued routine obstetrical care, sonogram EFW 38%  No Follow-up on file.

## 2016-07-31 NOTE — Progress Notes (Signed)
[place] [id number] wks,cephalic,ant left lat pl gr 1,bilat adnexa's wnl,fhr 135 bpm,afi 9.7 cm,efw 2436 g 38 %

## 2016-08-10 ENCOUNTER — Ambulatory Visit (INDEPENDENT_AMBULATORY_CARE_PROVIDER_SITE_OTHER): Payer: BLUE CROSS/BLUE SHIELD | Admitting: Obstetrics and Gynecology

## 2016-08-10 VITALS — BP 120/62 | HR 108 | Wt 174.0 lb

## 2016-08-10 DIAGNOSIS — Z3A36 36 weeks gestation of pregnancy: Secondary | ICD-10-CM

## 2016-08-10 DIAGNOSIS — Z331 Pregnant state, incidental: Secondary | ICD-10-CM

## 2016-08-10 DIAGNOSIS — Z3493 Encounter for supervision of normal pregnancy, unspecified, third trimester: Secondary | ICD-10-CM

## 2016-08-10 DIAGNOSIS — Z3483 Encounter for supervision of other normal pregnancy, third trimester: Secondary | ICD-10-CM

## 2016-08-10 DIAGNOSIS — Z1389 Encounter for screening for other disorder: Secondary | ICD-10-CM

## 2016-08-10 LAB — POCT URINALYSIS DIPSTICK
Blood, UA: NEGATIVE
Ketones, UA: NEGATIVE
Leukocytes, UA: NEGATIVE
Nitrite, UA: NEGATIVE

## 2016-08-10 NOTE — Progress Notes (Signed)
G2P0010  Estimated Date of Delivery: 09/05/16 LROB [redacted]w[redacted]d  Blood pressure 120/62, pulse (!) 108, weight 174 lb (78.9 kg).   Urine results:notable for trace glucose and protein  Chief Complaint  Patient presents with  . Routine Prenatal Visit    Patient complaints: none at this time. She plans to breastfeed.  Patient reports   good fetal movement,                            denies any bleeding , rupture of membranes,or regular contractions.   refer to the ob flow sheet for FH and FHR, ,                          Physical Examination: General appearance - alert, well appearing, and in no distress                                      Abdomen - FH 35cm ,                                                         -FHR 136                                                         soft, nontender, nondistended, no masses or organomegaly                                      Pelvic - examination not indicated                                            Questions were answered. Assessment: LROB G2P0010 @ [redacted]w[redacted]d                         Pt planning for postpartum tubal Urged patient to take hospital tour and even childbirth classes with father of baby if possible. Discussed tubal ligation post-delivery and risk/benefits to different time frames to have this completed.   Plan:  Continued routine obstetrical care,   F/u in 1 week for routine prenatal visit   By signing my name below, I, Sonum Patel, attest that this documentation has been prepared under the direction and in the presence of Jonnie Kind, MD. Electronically Signed: Sonum Patel, Education administrator. 08/10/16. 1:13 PM.  I personally performed the services described in this documentation, which was SCRIBED in my presence. The recorded information has been reviewed and considered accurate. It has been edited as necessary during review. Jonnie Kind, MD

## 2016-08-15 ENCOUNTER — Encounter: Payer: Self-pay | Admitting: Obstetrics & Gynecology

## 2016-08-15 ENCOUNTER — Ambulatory Visit (INDEPENDENT_AMBULATORY_CARE_PROVIDER_SITE_OTHER): Payer: BLUE CROSS/BLUE SHIELD | Admitting: Obstetrics & Gynecology

## 2016-08-15 VITALS — BP 100/70 | HR 78 | Wt 172.0 lb

## 2016-08-15 DIAGNOSIS — Z3493 Encounter for supervision of normal pregnancy, unspecified, third trimester: Secondary | ICD-10-CM

## 2016-08-15 DIAGNOSIS — Z3A36 36 weeks gestation of pregnancy: Secondary | ICD-10-CM

## 2016-08-15 DIAGNOSIS — Z331 Pregnant state, incidental: Secondary | ICD-10-CM

## 2016-08-15 DIAGNOSIS — Z1159 Encounter for screening for other viral diseases: Secondary | ICD-10-CM

## 2016-08-15 DIAGNOSIS — Z118 Encounter for screening for other infectious and parasitic diseases: Secondary | ICD-10-CM

## 2016-08-15 DIAGNOSIS — Z1389 Encounter for screening for other disorder: Secondary | ICD-10-CM

## 2016-08-15 DIAGNOSIS — O09522 Supervision of elderly multigravida, second trimester: Secondary | ICD-10-CM

## 2016-08-15 DIAGNOSIS — Z3483 Encounter for supervision of other normal pregnancy, third trimester: Secondary | ICD-10-CM

## 2016-08-15 LAB — POCT URINALYSIS DIPSTICK
Blood, UA: NEGATIVE
Glucose, UA: NEGATIVE
Leukocytes, UA: NEGATIVE
Nitrite, UA: NEGATIVE

## 2016-08-17 LAB — STREP GP B NAA: Strep Gp B NAA: POSITIVE — AB

## 2016-08-18 LAB — GC/CHLAMYDIA PROBE AMP
Chlamydia trachomatis, NAA: NEGATIVE
Neisseria gonorrhoeae by PCR: NEGATIVE

## 2016-08-22 ENCOUNTER — Ambulatory Visit (INDEPENDENT_AMBULATORY_CARE_PROVIDER_SITE_OTHER): Payer: BLUE CROSS/BLUE SHIELD | Admitting: Advanced Practice Midwife

## 2016-08-22 ENCOUNTER — Encounter: Payer: Self-pay | Admitting: Advanced Practice Midwife

## 2016-08-22 VITALS — BP 100/70 | HR 84 | Wt 176.0 lb

## 2016-08-22 DIAGNOSIS — Z331 Pregnant state, incidental: Secondary | ICD-10-CM

## 2016-08-22 DIAGNOSIS — R300 Dysuria: Secondary | ICD-10-CM

## 2016-08-22 DIAGNOSIS — Z3483 Encounter for supervision of other normal pregnancy, third trimester: Secondary | ICD-10-CM

## 2016-08-22 DIAGNOSIS — Z3A38 38 weeks gestation of pregnancy: Secondary | ICD-10-CM

## 2016-08-22 DIAGNOSIS — Z3493 Encounter for supervision of normal pregnancy, unspecified, third trimester: Secondary | ICD-10-CM

## 2016-08-22 DIAGNOSIS — Z1389 Encounter for screening for other disorder: Secondary | ICD-10-CM

## 2016-08-22 LAB — POCT URINALYSIS DIPSTICK
Blood, UA: NEGATIVE
Ketones, UA: NEGATIVE
Leukocytes, UA: NEGATIVE
Nitrite, UA: NEGATIVE

## 2016-08-22 NOTE — Progress Notes (Signed)
G2P0010 [redacted]w[redacted]d Estimated Date of Delivery: 09/05/16  Blood pressure 100/70, pulse 84, weight 176 lb (79.8 kg).   BP weight and urine results all reviewed and noted.  Please refer to the obstetrical flow sheet for the fundal height and fetal heart rate documentation:  Patient reports good fetal movement, denies any bleeding and no rupture of membranes symptoms or regular contractions. Patient is without complaints. All questions were answered.  Orders Placed This Encounter  Procedures  . Urine culture  . POCT urinalysis dipstick    Plan:  Continued routine obstetrical care,   Return in about 1 week (around 08/29/2016) for LROB.

## 2016-08-23 DIAGNOSIS — R300 Dysuria: Secondary | ICD-10-CM | POA: Diagnosis not present

## 2016-08-25 LAB — URINE CULTURE

## 2016-08-30 ENCOUNTER — Encounter: Payer: Self-pay | Admitting: Advanced Practice Midwife

## 2016-08-30 ENCOUNTER — Ambulatory Visit (INDEPENDENT_AMBULATORY_CARE_PROVIDER_SITE_OTHER): Payer: BLUE CROSS/BLUE SHIELD | Admitting: Advanced Practice Midwife

## 2016-08-30 VITALS — BP 102/64 | HR 93 | Wt 177.0 lb

## 2016-08-30 DIAGNOSIS — Z3A39 39 weeks gestation of pregnancy: Secondary | ICD-10-CM

## 2016-08-30 DIAGNOSIS — Z3483 Encounter for supervision of other normal pregnancy, third trimester: Secondary | ICD-10-CM

## 2016-08-30 DIAGNOSIS — Z1389 Encounter for screening for other disorder: Secondary | ICD-10-CM

## 2016-08-30 DIAGNOSIS — Z331 Pregnant state, incidental: Secondary | ICD-10-CM

## 2016-08-30 LAB — POCT URINALYSIS DIPSTICK
Blood, UA: NEGATIVE
Glucose, UA: NEGATIVE
Ketones, UA: NEGATIVE
Nitrite, UA: NEGATIVE
Protein, UA: NEGATIVE

## 2016-08-30 NOTE — Patient Instructions (Addendum)
AM I IN LABOR? What is labor? Labor is the work that your body does to birth your baby. Your uterus (the womb) contracts. Your cervix (the mouth of the uterus) opens. You will push your baby out into the world.  What do contractions (labor pains) feel like? When they first start, contractions usually feel like cramps during your period. Sometimes you feel pain in your back. Most often, contractions feel like muscles pulling painfully in your lower belly. At first, the contractions will probably be 15 to 20 minutes apart. They will not feel too painful. As labor goes on, the contractions get stronger, closer together, and more painful.  How do I time the contractions? Time your contractions by counting the number of minutes from the start of one contraction to the start of the next contraction.  What should I do when the contractions start? If it is night and you can sleep, sleep. If it happens during the day, here are some things you can do to take care of yourself at home: ? Walk. If the pains you are having are real labor, walking will make the contractions come faster and harder. If the contractions are not going to continue and be real labor, walking will make the contractions slow down. ? Take a shower or bath. This will help you relax. ? Eat. Labor is a big event. It takes a lot of energy. ? Drink water. Not drinking enough water can cause false labor (contractions that hurt but do not open your cervix). If this is true labor, drinking water will help you have strength to get through your labor. ? Take a nap. Get all the rest you can. ? Get a massage. If your labor is in your back, a strong massage on your lower back may feel very good. Getting a foot massage is always good. ? Don't panic. You can do this. Your body was made for this. You are strong!  When should I go to the hospital or call my health care provider? ? Your contractions have been 5 minutes apart or less for at least 1  hour. ? If several contractions are so painful you cannot walk or talk during one. ? Your bag of waters breaks. (You may have a big gush of water or just water that runs down your legs when you walk.)  Are there other reasons to call my health care provider? Yes, you should call your health care provider or go to the hospital if you start to bleed like you are having a period- blood that soaks your underwear or runs down your legs, if you have sudden severe pain, if your baby has not moved for several hours, or if you are leaking green fluid. The rule is as follows: If you are very concerned about something, call.  Round Ligament Pain During Pregnancy   Round ligament pain is a sharp pain or jabbing feeling often felt in the lower belly or groin area on one or both sides. It is one of the most common complaints during pregnancy and is considered a normal part of pregnancy. It is most often felt during the second trimester.   Here is what you need to know about round ligament pain, including some tips to help you feel better.   Causes of Round Ligament Pain   Several thick ligaments surround and support your womb (uterus) as it grows during pregnancy. One of them is called the round ligament.   The round ligament connects the  front part of the womb to your groin, the area where your legs attach to your pelvis. The round ligament normally tightens and relaxes slowly.   As your baby and womb grow, the round ligament stretches. That makes it more likely to become strained.   Sudden movements can cause the ligament to tighten quickly, like a rubber band snapping. This causes a sudden and quick jabbing feeling.   Symptoms of Round Ligament Pain   Round ligament pain can be concerning and uncomfortable. But it is considered normal as your body changes during pregnancy.   The symptoms of round ligament pain include a sharp, sudden spasm in the belly. It usually affects the right side, but it may  happen on both sides. The pain only lasts a few seconds.   Exercise may cause the pain, as will rapid movements such as:  sneezing  coughing  laughing  rolling over in bed  standing up too quickly   Treatment of Round Ligament Pain   Here are some tips that may help reduce your discomfort:   Pain relief. Take over-the-counter acetaminophen for pain, if necessary. Ask your doctor if this is OK.   Exercise. Get plenty of exercise to keep your stomach (core) muscles strong. Doing stretching exercises or prenatal yoga can be helpful. Ask your doctor which exercises are safe for you and your baby.   A helpful exercise involves putting your hands and knees on the floor, lowering your head, and pushing your backside into the air.   Avoid sudden movements. Change positions slowly (such as standing up or sitting down) to avoid sudden movements that may cause stretching and pain.   Flex your hips. Bend and flex your hips before you cough, sneeze, or laugh to avoid pulling on the ligaments.   Apply warmth. A heating pad or warm bath may be helpful. Ask your doctor if this is OK. Extreme heat can be dangerous to the baby.   You should try to modify your daily activity level and avoid positions that may worsen the condition.   When to Call the Doctor/Midwife   Always tell your doctor or midwife about any type of pain you have during pregnancy. Round ligament pain is quick and doesn't last long.   Call your health care provider immediately if you have:  severe pain  fever  chills  pain on urination  difficulty walking   Belly pain during pregnancy can be due to many different causes. It is important for your doctor to rule out more serious conditions, including pregnancy complications such as placenta abruption or non-pregnancy illnesses such as:  inguinal hernia  appendicitis  stomach, liver, and kidney problems  Preterm labor pains may sometimes be mistaken for round ligament pain.

## 2016-08-30 NOTE — Progress Notes (Signed)
G2P0010 [redacted]w[redacted]d Estimated Date of Delivery: 09/05/16  Blood pressure 102/64, pulse 93, weight 177 lb (80.3 kg).   BP weight and urine results all reviewed and noted.  Please refer to the obstetrical flow sheet for the fundal height and fetal heart rate documentation:  Patient reports good fetal movement, denies any bleeding and no rupture of membranes symptoms or regular contractions. Patient is without complaints. All questions were answered.  Orders Placed This Encounter  Procedures  . POCT urinalysis dipstick    Plan:  Continued routine obstetrical care,   No Follow-up on file.

## 2016-09-06 ENCOUNTER — Ambulatory Visit (INDEPENDENT_AMBULATORY_CARE_PROVIDER_SITE_OTHER): Payer: BLUE CROSS/BLUE SHIELD | Admitting: Women's Health

## 2016-09-06 VITALS — BP 100/44 | HR 93 | Wt 177.0 lb

## 2016-09-06 DIAGNOSIS — Z3483 Encounter for supervision of other normal pregnancy, third trimester: Secondary | ICD-10-CM

## 2016-09-06 DIAGNOSIS — Z1389 Encounter for screening for other disorder: Secondary | ICD-10-CM

## 2016-09-06 DIAGNOSIS — O480 Post-term pregnancy: Secondary | ICD-10-CM

## 2016-09-06 DIAGNOSIS — O26843 Uterine size-date discrepancy, third trimester: Secondary | ICD-10-CM | POA: Diagnosis not present

## 2016-09-06 DIAGNOSIS — Z3A41 41 weeks gestation of pregnancy: Secondary | ICD-10-CM

## 2016-09-06 DIAGNOSIS — Z331 Pregnant state, incidental: Secondary | ICD-10-CM

## 2016-09-06 LAB — POCT URINALYSIS DIPSTICK
Blood, UA: NEGATIVE
Glucose, UA: NEGATIVE
Ketones, UA: NEGATIVE
Leukocytes, UA: NEGATIVE
Nitrite, UA: NEGATIVE
Protein, UA: NEGATIVE

## 2016-09-06 NOTE — Patient Instructions (Signed)
Your induction is scheduled for Wed 11/20 @ 7:30am. Go to [facility] hospital, Maternity Admissions Unit (Emergency) entrance and let them know you are there to be induced. They will send someone from Labor & Delivery to come get you.    Call the office [phone]) or go to [facility] if:  You begin to have strong, frequent contractions  Your water breaks.  Sometimes it is a big gush of fluid, sometimes it is just a trickle that keeps getting your panties wet or running down your legs  You have vaginal bleeding.  It is normal to have a small amount of spotting if your cervix was checked.   You don't feel your baby moving like normal.  If you don't, get you something to eat and drink and lay down and focus on feeling your baby move.  You should feel at least 10 movements in 2 hours.  If you don't, you should call the office or go to [place] Contractions Contractions of the uterus can occur throughout pregnancy. Contractions are not always a sign that you are in labor.  WHAT ARE BRAXTON HICKS CONTRACTIONS?  Contractions that occur before labor are called Braxton Hicks contractions, or false labor. Toward the end of pregnancy (32-34 weeks), these contractions can develop more often and may become more forceful. This is not true labor because these contractions do not result in opening (dilatation) and thinning of the cervix. They are sometimes difficult to tell apart from true labor because these contractions can be forceful and people have different pain tolerances. You should not feel embarrassed if you go to the hospital with false labor. Sometimes, the only way to tell if you are in true labor is for your health care provider to look for changes in the cervix. If there are no prenatal problems or other health problems associated with the pregnancy, it is completely safe to be sent home with false labor and await the onset of true labor. HOW CAN YOU TELL THE DIFFERENCE  BETWEEN TRUE AND FALSE LABOR? False Labor   The contractions of false labor are usually shorter and not as hard as those of true labor.   The contractions are usually irregular.   The contractions are often felt in the front of the lower abdomen and in the groin.   The contractions may go away when you walk around or change positions while lying down.   The contractions get weaker and are shorter lasting as time goes on.   The contractions do not usually become progressively stronger, regular, and closer together as with true labor.  True Labor   Contractions in true labor last 30-70 seconds, become very regular, usually become more intense, and increase in frequency.   The contractions do not go away with walking.   The discomfort is usually felt in the top of the uterus and spreads to the lower abdomen and low back.   True labor can be determined by your health care provider with an exam. This will show that the cervix is dilating and getting thinner.  WHAT TO REMEMBER  Keep up with your usual exercises and follow other instructions given by your health care provider.   Take medicines as directed by your health care provider.   Keep your regular prenatal appointments.   Eat and drink lightly if you think you are going into labor.   If Braxton Hicks contractions are making you uncomfortable:   Change your position from lying  down or resting to walking, or from walking to resting.   Sit and rest in a tub of warm water.   Drink 2-3 glasses of water. Dehydration may cause these contractions.   Do slow and deep breathing several times an hour.  WHEN SHOULD I SEEK IMMEDIATE MEDICAL CARE? Seek immediate medical care if:  Your contractions become stronger, more regular, and closer together.   You have fluid leaking or gushing from your vagina.   You have a fever.   You pass blood-tinged mucus.   You have vaginal bleeding.   You have continuous  abdominal pain.   You have low back pain that you never had before.   You feel your baby's head pushing down and causing pelvic pressure.   Your baby is not moving as much as it used to.  This information is not intended to replace advice given to you by your health care provider. Make sure you discuss any questions you have with your health care provider. Document Released: 09/10/2005 Document Revised: 01/02/2016 Document Reviewed: 06/22/2013 Elsevier Interactive Patient Education  2017 [employer].

## 2016-09-06 NOTE — Progress Notes (Signed)
Low-risk OB appointment G2P0010 [redacted]w[redacted]d Estimated Date of Delivery: 09/05/16 BP (!) 100/44   Pulse 93   Wt 177 lb (80.3 kg)   BMI 28.57 kg/m   BP, weight, and urine reviewed.  Refer to obstetrical flow sheet for FH & FHR.  Reports good fm.  Denies regular uc's, lof, vb, or uti s/s. No complaints. SVE per request: 2/80/-2, vtx posterior. Offered membrane sweeping, discussed r/b- pt decided to proceed, so membranes swept. Cx now 3/80/-2 Reviewed labor s/s, fkc. Plan:  IOL for postdates 11/20 @ 0730am if needed F/U in asap for efw/afi u/s for s<d (no visit), then 4-6wks for pp visit Last u/s for s<d ~6wks ago, was normal

## 2016-09-07 ENCOUNTER — Encounter (HOSPITAL_COMMUNITY): Payer: Self-pay

## 2016-09-07 ENCOUNTER — Encounter (HOSPITAL_COMMUNITY): Payer: Self-pay | Admitting: *Deleted

## 2016-09-07 ENCOUNTER — Inpatient Hospital Stay (HOSPITAL_COMMUNITY): Payer: BLUE CROSS/BLUE SHIELD | Admitting: Anesthesiology

## 2016-09-07 ENCOUNTER — Inpatient Hospital Stay (HOSPITAL_COMMUNITY)
Admission: AD | Admit: 2016-09-07 | Discharge: 2016-09-10 | DRG: 767 | Disposition: A | Discharge status: Home / Self Care | Payer: BLUE CROSS/BLUE SHIELD | Source: Ambulatory Visit | Attending: Obstetrics and Gynecology | Admitting: Obstetrics and Gynecology

## 2016-09-07 ENCOUNTER — Other Ambulatory Visit: Payer: BLUE CROSS/BLUE SHIELD

## 2016-09-07 ENCOUNTER — Inpatient Hospital Stay (HOSPITAL_COMMUNITY)
Admission: AD | Admit: 2016-09-07 | Discharge: 2016-09-07 | Disposition: A | Discharge status: Home / Self Care | Payer: BLUE CROSS/BLUE SHIELD | Source: Ambulatory Visit | Attending: Obstetrics and Gynecology | Admitting: Obstetrics and Gynecology

## 2016-09-07 DIAGNOSIS — Z302 Encounter for sterilization: Secondary | ICD-10-CM

## 2016-09-07 DIAGNOSIS — O99824 Streptococcus B carrier state complicating childbirth: Secondary | ICD-10-CM | POA: Diagnosis not present

## 2016-09-07 DIAGNOSIS — Z8249 Family history of ischemic heart disease and other diseases of the circulatory system: Secondary | ICD-10-CM

## 2016-09-07 DIAGNOSIS — Z833 Family history of diabetes mellitus: Secondary | ICD-10-CM | POA: Diagnosis not present

## 2016-09-07 DIAGNOSIS — Z3A40 40 weeks gestation of pregnancy: Secondary | ICD-10-CM | POA: Diagnosis not present

## 2016-09-07 DIAGNOSIS — R0902 Hypoxemia: Secondary | ICD-10-CM

## 2016-09-07 DIAGNOSIS — Z6791 Unspecified blood type, Rh negative: Secondary | ICD-10-CM

## 2016-09-07 DIAGNOSIS — Q241 Levocardia: Secondary | ICD-10-CM | POA: Diagnosis not present

## 2016-09-07 DIAGNOSIS — O480 Post-term pregnancy: Secondary | ICD-10-CM | POA: Diagnosis not present

## 2016-09-07 DIAGNOSIS — O26893 Other specified pregnancy related conditions, third trimester: Secondary | ICD-10-CM | POA: Diagnosis present

## 2016-09-07 DIAGNOSIS — Z23 Encounter for immunization: Secondary | ICD-10-CM | POA: Diagnosis not present

## 2016-09-07 DIAGNOSIS — Z3493 Encounter for supervision of normal pregnancy, unspecified, third trimester: Secondary | ICD-10-CM | POA: Diagnosis not present

## 2016-09-07 LAB — CBC
HCT: 32.9 % — ABNORMAL LOW (ref 36.0–46.0)
Hemoglobin: 12 g/dL (ref 12.0–15.0)
MCH: 31.3 pg (ref 26.0–34.0)
MCHC: 36.5 g/dL — ABNORMAL HIGH (ref 30.0–36.0)
MCV: 85.7 fL (ref 78.0–100.0)
Platelets: 194 10*3/uL (ref 150–400)
RBC: 3.84 MIL/uL — ABNORMAL LOW (ref 3.87–5.11)
RDW: 12.9 % (ref 11.5–15.5)
WBC: 15.1 10*3/uL — ABNORMAL HIGH (ref 4.0–10.5)

## 2016-09-07 LAB — OB RESULTS CONSOLE GBS: GBS: POSITIVE

## 2016-09-07 MED ORDER — TERBUTALINE SULFATE 1 MG/ML IJ SOLN
0.2500 mg | Freq: Once | INTRAMUSCULAR | Status: DC | PRN
  Filled 2016-09-07: qty 1

## 2016-09-07 MED ORDER — OXYTOCIN 40 UNITS IN LACTATED RINGERS INFUSION - SIMPLE MED: 2.5000 [IU]/h | INTRAVENOUS | Status: DC

## 2016-09-07 MED ORDER — LACTATED RINGERS IV SOLN: 500.0000 mL | INTRAVENOUS | Status: DC | PRN

## 2016-09-07 MED ORDER — PHENYLEPHRINE 40 MCG/ML (10ML) SYRINGE FOR IV PUSH (FOR BLOOD PRESSURE SUPPORT)
80.0000 ug | PREFILLED_SYRINGE | INTRAVENOUS | Status: DC | PRN
  Filled 2016-09-07: qty 10
  Filled 2016-09-07: qty 5

## 2016-09-07 MED ORDER — EPHEDRINE 5 MG/ML INJ
10.0000 mg | INTRAVENOUS | Status: DC | PRN
  Administered 2016-09-07: 10 mg via INTRAVENOUS
  Filled 2016-09-07: qty 4

## 2016-09-07 MED ORDER — PHENYLEPHRINE 40 MCG/ML (10ML) SYRINGE FOR IV PUSH (FOR BLOOD PRESSURE SUPPORT)
80.0000 ug | PREFILLED_SYRINGE | INTRAVENOUS | Status: DC | PRN
  Administered 2016-09-07 (×2): 80 ug via INTRAVENOUS
  Filled 2016-09-07: qty 5
  Filled 2016-09-07: qty 10

## 2016-09-07 MED ORDER — OXYTOCIN BOLUS FROM INFUSION: 500.0000 mL | Freq: Once | INTRAVENOUS | Status: DC

## 2016-09-07 MED ORDER — FENTANYL 2.5 MCG/ML BUPIVACAINE 1/10 % EPIDURAL INFUSION (WH - ANES)
14.0000 mL/h | INTRAMUSCULAR | Status: DC | PRN
Start: 2016-09-07 — End: 2016-09-08
  Administered 2016-09-07 (×2): 14 mL/h via EPIDURAL
  Filled 2016-09-07 (×2): qty 100

## 2016-09-07 MED ORDER — DIPHENHYDRAMINE HCL 50 MG/ML IJ SOLN
12.5000 mg | INTRAMUSCULAR | Status: DC | PRN
  Administered 2016-09-07: 12.5 mg via INTRAVENOUS
  Filled 2016-09-07: qty 1

## 2016-09-07 MED ORDER — LIDOCAINE HCL (PF) 1 % IJ SOLN
30.0000 mL | INTRAMUSCULAR | Status: DC | PRN
  Filled 2016-09-07: qty 30

## 2016-09-07 MED ORDER — PENICILLIN G POT IN DEXTROSE 60000 UNIT/ML IV SOLN
3.0000 10*6.[IU] | INTRAVENOUS | Status: DC
  Administered 2016-09-07 – 2016-09-08 (×3): 3 10*6.[IU] via INTRAVENOUS
  Filled 2016-09-07 (×8): qty 50

## 2016-09-07 MED ORDER — PENICILLIN G POTASSIUM 5000000 UNITS IJ SOLR
5.0000 10*6.[IU] | Freq: Once | INTRAVENOUS | Status: AC
  Administered 2016-09-07: 5 10*6.[IU] via INTRAVENOUS
  Filled 2016-09-07: qty 5

## 2016-09-07 MED ORDER — OXYCODONE-ACETAMINOPHEN 5-325 MG PO TABS
1.0000 | ORAL_TABLET | ORAL | Status: DC | PRN
Start: 2016-09-07 — End: 2016-09-08

## 2016-09-07 MED ORDER — OXYTOCIN 40 UNITS IN LACTATED RINGERS INFUSION - SIMPLE MED
1.0000 m[IU]/min | INTRAVENOUS | Status: DC
  Administered 2016-09-07: 2 m[IU]/min via INTRAVENOUS
  Filled 2016-09-07: qty 1000

## 2016-09-07 MED ORDER — LACTATED RINGERS IV SOLN
INTRAVENOUS | Status: DC
  Administered 2016-09-07 – 2016-09-08 (×3): via INTRAVENOUS

## 2016-09-07 MED ORDER — ONDANSETRON HCL 4 MG/2ML IJ SOLN
4.0000 mg | Freq: Four times a day (QID) | INTRAMUSCULAR | Status: DC | PRN
  Administered 2016-09-07: 4 mg via INTRAVENOUS
  Filled 2016-09-07: qty 2

## 2016-09-07 MED ORDER — LACTATED RINGERS IV SOLN
500.0000 mL | Freq: Once | INTRAVENOUS | Status: AC
  Administered 2016-09-07: 500 mL via INTRAVENOUS

## 2016-09-07 MED ORDER — FENTANYL CITRATE (PF) 100 MCG/2ML IJ SOLN
100.0000 ug | INTRAMUSCULAR | Status: DC | PRN
  Administered 2016-09-07 (×2): 100 ug via INTRAVENOUS
  Filled 2016-09-07 (×2): qty 2

## 2016-09-07 MED ORDER — EPHEDRINE 5 MG/ML INJ
10.0000 mg | INTRAVENOUS | Status: DC | PRN
  Filled 2016-09-07: qty 4

## 2016-09-07 MED ORDER — SOD CITRATE-CITRIC ACID 500-334 MG/5ML PO SOLN: 30.0000 mL | ORAL | Status: DC | PRN

## 2016-09-07 MED ORDER — ONDANSETRON 8 MG PO TBDP
8.0000 mg | ORAL_TABLET | Freq: Once | ORAL | Status: AC
  Administered 2016-09-07: 8 mg via ORAL
  Filled 2016-09-07: qty 1

## 2016-09-07 MED ORDER — ACETAMINOPHEN 325 MG PO TABS: 650.0000 mg | ORAL_TABLET | ORAL | Status: DC | PRN

## 2016-09-07 MED ORDER — LIDOCAINE HCL (PF) 1 % IJ SOLN
INTRAMUSCULAR | Status: DC | PRN
  Administered 2016-09-07 (×2): 5 mL via EPIDURAL

## 2016-09-07 MED ORDER — OXYCODONE-ACETAMINOPHEN 5-325 MG PO TABS
2.0000 | ORAL_TABLET | ORAL | Status: DC | PRN
  Administered 2016-09-08: 2 via ORAL
  Filled 2016-09-07: qty 2

## 2016-09-07 NOTE — Anesthesia Preprocedure Evaluation (Signed)
Anesthesia Evaluation  Patient identified by MRN, date of birth, ID band Patient awake    Reviewed: Allergy & Precautions, NPO status , Patient's Chart, lab work & pertinent test results  Airway Mallampati: II  TM Distance: >3 FB Neck ROM: Full    Dental no notable dental hx. (+) Dental Advisory Given   Pulmonary neg pulmonary ROS,    Pulmonary exam normal        Cardiovascular negative cardio ROS Normal cardiovascular exam     Neuro/Psych negative neurological ROS  negative psych ROS   GI/Hepatic negative GI ROS, Neg liver ROS,   Endo/Other  negative endocrine ROS  Renal/GU negative Renal ROS  negative genitourinary   Musculoskeletal negative musculoskeletal ROS (+)   Abdominal   Peds negative pediatric ROS (+)  Hematology negative hematology ROS (+)   Anesthesia Other Findings   Reproductive/Obstetrics (+) Pregnancy                             Anesthesia Physical Anesthesia Plan  ASA: II  Anesthesia Plan: Epidural   Post-op Pain Management:    Induction:   Airway Management Planned:   Additional Equipment:   Intra-op Plan:   Post-operative Plan:   Informed Consent: I have reviewed the patients History and Physical, chart, labs and discussed the procedure including the risks, benefits and alternatives for the proposed anesthesia with the patient or authorized representative who has indicated his/her understanding and acceptance.   Dental advisory given  Plan Discussed with: Anesthesiologist  Anesthesia Plan Comments:         Anesthesia Quick Evaluation

## 2016-09-07 NOTE — Progress Notes (Signed)
Patient ID: Judy Thompson, female   DOB: May 26, 1976, 40 y.o.   MRN: [id number] Comfortable w/ epidural VSS, afeb FHR 130s, +accels, no decels, occ mi variables Ctx q 3-32min w/ Pit 2mu/min Cx 5+/90/-1; AROM for clear fluid  IUP@term  Early active labor  Plan to check cx in 2 hrs or sooner prn pressure Hopeful that AROM will increase labor  Serita Grammes CNM 09/07/2016 7:12 PM

## 2016-09-07 NOTE — Progress Notes (Signed)
Patient ID: Heywood Bene, female   DOB: 1976-08-28, 40 y.o.   MRN: [id number]  Recently rec'd Fentanyl- mod effective  VSS, afeb FHR 120, +10x10 accels, no decels Ctx q 4-6 mins Cx 4+/100/-2 per RN  IUP@term  Latent labor  Plan cx check in 2-3 hrs for progress  Serita Grammes CNM 09/07/2016 4:39 PM

## 2016-09-07 NOTE — Discharge Instructions (Signed)

## 2016-09-07 NOTE — Anesthesia Pain Management Evaluation Note (Signed)
  CRNA Pain Management Visit Note  Patient: Judy Thompson, 40 y.o., female  "Hello I am a member of the anesthesia team at [facility]. We have an anesthesia team available at all times to provide care throughout the hospital, including epidural management and anesthesia for C-section. I don't know your plan for the delivery whether it a natural birth, water birth, IV sedation, nitrous supplementation, doula or epidural, but we want to meet your pain goals."   1.Was your pain managed to your expectations on prior hospitalizations?   No prior hospitalizations  2.What is your expectation for pain management during this hospitalization?     Epidural and IV pain meds  3.How can we help you reach that goal? IV pain meds then epidural when patient is ready.  Record the patient's initial score and the patient's pain goal.   Pain: 8  Pain Goal: 4 [facility] wants you to be able to say your pain was always managed very well.  Willard Madrigal 09/07/2016

## 2016-09-07 NOTE — MAU Note (Signed)
Pt presents with complaint of contractions, denies ROM. Some spotting.

## 2016-09-07 NOTE — MAU Note (Deleted)
Notified provider that patient is here for ROM at 0400. Patient is scheduled for a csection on Sunday for breech. Provider said to get an ultrasound to check presentation and call him back with the results.

## 2016-09-07 NOTE — Progress Notes (Signed)
Patient ID: Judy Thompson, female   DOB: [birth year], 40 y.o.   MRN: [id number]  Comfortable w/ epidural VSS, afeb FHR 115-120, +accels, no decels Ctx irreg Cx per RN 4+/90/-2  IUP@term  Latent phase  Will begin Pit to increase labor  Serita Grammes 09/07/2016 4:36 PM

## 2016-09-07 NOTE — MAU Note (Signed)
Pt was sent home from MAU earlier this morning. Pt is now having more painful contractions.

## 2016-09-07 NOTE — Anesthesia Procedure Notes (Signed)
Epidural Patient location during procedure: OB Start time: 09/07/2016 2:38 PM End time: 09/07/2016 2:51 PM  Staffing Anesthesiologist: Duane Boston Performed: anesthesiologist   Preanesthetic Checklist Completed: patient identified, site marked, pre-op evaluation, timeout performed, IV checked, risks and benefits discussed and monitors and equipment checked  Epidural Patient position: sitting Prep: DuraPrep Patient monitoring: heart rate, cardiac monitor, continuous pulse ox and blood pressure Approach: midline Location: L2-L3 Injection technique: LOR saline  Needle:  Needle type: Tuohy  Needle gauge: 17 G Needle length: 9 cm Needle insertion depth: 6 cm Catheter size: 20 Guage Catheter at skin depth: 11 cm Test dose: negative and Other  Assessment Events: blood not aspirated, injection not painful, no injection resistance and negative IV test  Additional Notes Informed consent obtained prior to proceeding including risk of failure, 1% risk of PDPH, risk of minor discomfort and bruising.  Discussed rare but serious complications including epidural abscess, permanent nerve injury, epidural hematoma.  Discussed alternatives to epidural analgesia and patient desires to proceed.  Timeout performed pre-procedure verifying patient name, procedure, and platelet count.  Patient tolerated procedure well.

## 2016-09-07 NOTE — H&P (Signed)
Judy Thompson is a 40 y.o. female G2P0010 at 40w 2d by 6-week ultrasound presenting for SOL. Pt reports + fetal movement, denies vaginal bleeding, denies leaking of fluid. OB History    Gravida Para Term Preterm AB Living   2       1     SAB TAB Ectopic Multiple Live Births   1             Past Medical History:  Diagnosis Date  . Medical history non-contributory    Past Surgical History:  Procedure Laterality Date  . NO PAST SURGERIES     Family History: family history includes Cancer in her mother; Diabetes in her paternal grandmother; Heart disease in her father; Hypertension in her mother and paternal grandmother. Social History:  reports that she has never smoked. She has never used smokeless tobacco. She reports that she does not drink alcohol or use drugs.     Maternal Diabetes: No Genetic Screening: Normal Maternal Ultrasounds/Referrals: Normal Fetal Ultrasounds or other Referrals:  None Maternal Substance Abuse:  No Significant Maternal Medications:  None Significant Maternal Lab Results:  Lab values include: Group B Strep positive, Rh negative Other Comments:  None  Review of Systems  Constitutional: Negative.   Eyes: Negative.   Neurological: Negative.    Maternal Medical History:  Reason for admission: Contractions.   Contractions: Onset was 6-12 hours ago.   Frequency: irregular.   Perceived severity is moderate.    Fetal activity: Perceived fetal activity is normal.   Last perceived fetal movement was within the past hour.    Prenatal complications: no prenatal complications Prenatal Complications - Diabetes: none.    Dilation: 4 Effacement (%): 100 Station: -1 Exam by:: B Mosca Blood pressure 114/66, pulse 84, temperature 98.1 F (36.7 C), resp. rate 18, height 5\' 7"  (1.702 m), weight 79.6 kg (175 lb 6.4 oz). Maternal Exam:  Uterine Assessment: Contraction strength is moderate.  Contraction frequency is irregular.  Moderate contractions q 3  minutes by toco and palpation.  Abdomen: Patient reports no abdominal tenderness.   Fetal Exam Fetal Monitor Review: Mode: ultrasound.   Baseline rate: 130.  Variability: moderate (6-25 bpm).   Pattern: accelerations present and no decelerations.    Fetal State Assessment: Category I - tracings are normal.     Physical Exam  Constitutional: She is oriented to person, place, and time. She appears well-developed and well-nourished.  HENT:  Head: Normocephalic.  Cardiovascular: Normal rate.   Respiratory: Effort normal.  GI:  Gravid  Musculoskeletal: Normal range of motion.  Neurological: She is alert and oriented to person, place, and time.  Skin: Skin is warm and dry.  Psychiatric: She has a normal mood and affect. Her behavior is normal. Thought content normal.    Prenatal labs: ABO, Rh: O/Negative/-- (04/20 1505) Antibody: Negative (09/12 0907) Rubella: 1.84 (04/20 1505) RPR: Non Reactive (09/12 0907)  HBsAg: Negative (04/20 1505)  HIV: Non Reactive (09/12 0907)  GBS: Positive (11/22 1500)   Assessment/Plan: IUP @ 40w 2d in early active labor Category I tracing: FHT 130s, + accels, no decels GBS Positive, PCN Rh neg  Admit to [facility] Analgesia/Anesthesia PRN Expectant management, NSVD Plan eval for Rhogam PP  Boy, breast Outpatient circ BTL consent signed 06/26/2016  Mallie Snooks, [place] 09/07/2016, 11:28 AM   CNM attestation:  I have seen and examined this patient; I agree with above documentation in the student midwife's note.   Judy Thompson is a 40 y.o.  G2P0010 here for early active labor  PE: BP 102/63   Pulse 74   Temp 98.9 F (37.2 C) (Oral)   Resp 18   Ht 5\' 7"  (1.702 m)   Wt 79.4 kg (175 lb)   SpO2 100%   BMI 27.41 kg/m   Resp: normal effort, no distress Abd: gravid  ROS, labs, PMH reviewed  Plan: Admit to [facility] Expectant management for now PCN for GBS ppx Rhogam eval PP Anticipate SVD  Jessi Pitstick,  Elissa Grieshop CNM 09/07/2016, 4:41 PM

## 2016-09-08 ENCOUNTER — Encounter (HOSPITAL_COMMUNITY): Payer: Self-pay

## 2016-09-08 ENCOUNTER — Inpatient Hospital Stay (HOSPITAL_COMMUNITY): Payer: BLUE CROSS/BLUE SHIELD | Admitting: Anesthesiology

## 2016-09-08 ENCOUNTER — Encounter (HOSPITAL_COMMUNITY)
Admission: AD | Disposition: A | Discharge status: Home / Self Care | Payer: Self-pay | Source: Ambulatory Visit | Attending: Obstetrics and Gynecology

## 2016-09-08 DIAGNOSIS — Z302 Encounter for sterilization: Secondary | ICD-10-CM

## 2016-09-08 DIAGNOSIS — O99824 Streptococcus B carrier state complicating childbirth: Secondary | ICD-10-CM

## 2016-09-08 DIAGNOSIS — Z6791 Unspecified blood type, Rh negative: Secondary | ICD-10-CM

## 2016-09-08 DIAGNOSIS — Z3A40 40 weeks gestation of pregnancy: Secondary | ICD-10-CM

## 2016-09-08 DIAGNOSIS — O26893 Other specified pregnancy related conditions, third trimester: Secondary | ICD-10-CM

## 2016-09-08 HISTORY — PX: TUBAL LIGATION: SHX77

## 2016-09-08 LAB — RPR: RPR Ser Ql: NONREACTIVE

## 2016-09-08 SURGERY — LIGATION, FALLOPIAN TUBE, POSTPARTUM
Anesthesia: Epidural | Site: Abdomen | Laterality: Bilateral

## 2016-09-08 MED ORDER — DIPHENHYDRAMINE HCL 25 MG PO CAPS
25.0000 mg | ORAL_CAPSULE | ORAL | Status: DC | PRN
  Filled 2016-09-08: qty 1

## 2016-09-08 MED ORDER — SODIUM BICARBONATE 8.4 % IV SOLN
INTRAVENOUS | Status: AC
  Filled 2016-09-08: qty 50

## 2016-09-08 MED ORDER — NALOXONE HCL 0.4 MG/ML IJ SOLN: 0.4000 mg | INTRAMUSCULAR | Status: DC | PRN

## 2016-09-08 MED ORDER — NALBUPHINE HCL 10 MG/ML IJ SOLN: 5.0000 mg | INTRAMUSCULAR | Status: DC | PRN

## 2016-09-08 MED ORDER — NALOXONE HCL 2 MG/2ML IJ SOSY
1.0000 ug/kg/h | PREFILLED_SYRINGE | INTRAVENOUS | Status: DC | PRN
  Filled 2016-09-08: qty 2

## 2016-09-08 MED ORDER — BENZOCAINE-MENTHOL 20-0.5 % EX AERO
1.0000 "application " | INHALATION_SPRAY | CUTANEOUS | Status: DC | PRN
  Administered 2016-09-08: 1 via TOPICAL
  Filled 2016-09-08: qty 56

## 2016-09-08 MED ORDER — SODIUM CHLORIDE 0.9% FLUSH: 3.0000 mL | INTRAVENOUS | Status: DC | PRN

## 2016-09-08 MED ORDER — NALBUPHINE HCL 10 MG/ML IJ SOLN: 5.0000 mg | Freq: Once | INTRAMUSCULAR | Status: DC | PRN

## 2016-09-08 MED ORDER — RHO D IMMUNE GLOBULIN 1500 UNIT/2ML IJ SOSY
300.0000 ug | PREFILLED_SYRINGE | Freq: Once | INTRAMUSCULAR | Status: AC
  Administered 2016-09-08: 300 ug via INTRAVENOUS
  Filled 2016-09-08: qty 2

## 2016-09-08 MED ORDER — COCONUT OIL OIL: 1.0000 "application " | TOPICAL_OIL | Status: DC | PRN

## 2016-09-08 MED ORDER — SODIUM BICARBONATE 8.4 % IV SOLN
INTRAVENOUS | Status: DC | PRN
  Administered 2016-09-08: 5 mL via EPIDURAL
  Administered 2016-09-08: 7 mL via EPIDURAL
  Administered 2016-09-08 (×2): 3 mL via EPIDURAL
  Administered 2016-09-08: 5 mL via EPIDURAL

## 2016-09-08 MED ORDER — ONDANSETRON HCL 4 MG/2ML IJ SOLN: 4.0000 mg | INTRAMUSCULAR | Status: DC | PRN

## 2016-09-08 MED ORDER — TETANUS-DIPHTH-ACELL PERTUSSIS 5-2.5-18.5 LF-MCG/0.5 IM SUSP: 0.5000 mL | Freq: Once | INTRAMUSCULAR | Status: DC

## 2016-09-08 MED ORDER — LACTATED RINGERS IV SOLN
INTRAVENOUS | Status: DC | PRN
  Administered 2016-09-08: 14:00:00 via INTRAVENOUS

## 2016-09-08 MED ORDER — ONDANSETRON HCL 4 MG/2ML IJ SOLN
INTRAMUSCULAR | Status: DC | PRN
  Administered 2016-09-08: 4 mg via INTRAVENOUS

## 2016-09-08 MED ORDER — MIDAZOLAM HCL 2 MG/2ML IJ SOLN
INTRAMUSCULAR | Status: AC
  Filled 2016-09-08: qty 2

## 2016-09-08 MED ORDER — SCOPOLAMINE 1 MG/3DAYS TD PT72: 1.0000 | MEDICATED_PATCH | Freq: Once | TRANSDERMAL | Status: DC

## 2016-09-08 MED ORDER — METOCLOPRAMIDE HCL 10 MG PO TABS
10.0000 mg | ORAL_TABLET | Freq: Once | ORAL | Status: AC
  Administered 2016-09-08: 10 mg via ORAL
  Filled 2016-09-08: qty 1

## 2016-09-08 MED ORDER — PRENATAL MULTIVITAMIN CH
1.0000 | ORAL_TABLET | Freq: Every day | ORAL | Status: DC
  Administered 2016-09-09 – 2016-09-10 (×2): 1 via ORAL
  Filled 2016-09-08 (×2): qty 1

## 2016-09-08 MED ORDER — KETOROLAC TROMETHAMINE 30 MG/ML IJ SOLN: 30.0000 mg | Freq: Four times a day (QID) | INTRAMUSCULAR | Status: AC | PRN

## 2016-09-08 MED ORDER — IBUPROFEN 600 MG PO TABS
600.0000 mg | ORAL_TABLET | Freq: Four times a day (QID) | ORAL | Status: DC
  Administered 2016-09-08 – 2016-09-10 (×9): 600 mg via ORAL
  Filled 2016-09-08 (×10): qty 1

## 2016-09-08 MED ORDER — ONDANSETRON HCL 4 MG/2ML IJ SOLN
INTRAMUSCULAR | Status: AC
  Filled 2016-09-08: qty 2

## 2016-09-08 MED ORDER — OXYCODONE HCL 5 MG PO TABS
5.0000 mg | ORAL_TABLET | ORAL | Status: DC | PRN
  Administered 2016-09-08 – 2016-09-09 (×2): 5 mg via ORAL
  Filled 2016-09-08 (×3): qty 1

## 2016-09-08 MED ORDER — DIPHENHYDRAMINE HCL 25 MG PO CAPS: 25.0000 mg | ORAL_CAPSULE | Freq: Four times a day (QID) | ORAL | Status: DC | PRN

## 2016-09-08 MED ORDER — ZOLPIDEM TARTRATE 5 MG PO TABS: 5.0000 mg | ORAL_TABLET | Freq: Every evening | ORAL | Status: DC | PRN

## 2016-09-08 MED ORDER — BUPIVACAINE HCL (PF) 0.25 % IJ SOLN
INTRAMUSCULAR | Status: DC | PRN
  Administered 2016-09-08: 20 mL
  Administered 2016-09-08: 10 mL

## 2016-09-08 MED ORDER — HYDROMORPHONE HCL 1 MG/ML IJ SOLN: 0.2500 mg | INTRAMUSCULAR | Status: DC | PRN

## 2016-09-08 MED ORDER — MIDAZOLAM HCL 2 MG/2ML IJ SOLN
INTRAMUSCULAR | Status: DC | PRN
  Administered 2016-09-08 (×2): 1 mg via INTRAVENOUS

## 2016-09-08 MED ORDER — SENNOSIDES-DOCUSATE SODIUM 8.6-50 MG PO TABS
2.0000 | ORAL_TABLET | ORAL | Status: DC
  Administered 2016-09-09 – 2016-09-10 (×2): 2 via ORAL
  Filled 2016-09-08 (×3): qty 2

## 2016-09-08 MED ORDER — WITCH HAZEL-GLYCERIN EX PADS: 1.0000 "application " | MEDICATED_PAD | CUTANEOUS | Status: DC | PRN

## 2016-09-08 MED ORDER — ACETAMINOPHEN 325 MG PO TABS: 650.0000 mg | ORAL_TABLET | ORAL | Status: DC | PRN

## 2016-09-08 MED ORDER — DIPHENHYDRAMINE HCL 50 MG/ML IJ SOLN: 12.5000 mg | INTRAMUSCULAR | Status: DC | PRN

## 2016-09-08 MED ORDER — ONDANSETRON HCL 4 MG PO TABS
4.0000 mg | ORAL_TABLET | ORAL | Status: DC | PRN
  Administered 2016-09-09: 4 mg via ORAL
  Filled 2016-09-08: qty 1

## 2016-09-08 MED ORDER — MEPERIDINE HCL 25 MG/ML IJ SOLN: 6.2500 mg | INTRAMUSCULAR | Status: DC | PRN

## 2016-09-08 MED ORDER — 0.9 % SODIUM CHLORIDE (POUR BTL) OPTIME
TOPICAL | Status: DC | PRN
  Administered 2016-09-08: 1000 mL

## 2016-09-08 MED ORDER — FENTANYL CITRATE (PF) 100 MCG/2ML IJ SOLN
INTRAMUSCULAR | Status: AC
  Filled 2016-09-08: qty 2

## 2016-09-08 MED ORDER — LIDOCAINE-EPINEPHRINE (PF) 2 %-1:200000 IJ SOLN
INTRAMUSCULAR | Status: AC
  Filled 2016-09-08: qty 40

## 2016-09-08 MED ORDER — SIMETHICONE 80 MG PO CHEW
80.0000 mg | CHEWABLE_TABLET | ORAL | Status: DC | PRN
  Administered 2016-09-09 – 2016-09-10 (×2): 80 mg via ORAL
  Filled 2016-09-08 (×2): qty 1

## 2016-09-08 MED ORDER — PROMETHAZINE HCL 25 MG/ML IJ SOLN: 6.2500 mg | INTRAMUSCULAR | Status: DC | PRN

## 2016-09-08 MED ORDER — FAMOTIDINE 20 MG PO TABS
40.0000 mg | ORAL_TABLET | Freq: Once | ORAL | Status: AC
  Administered 2016-09-08: 40 mg via ORAL
  Filled 2016-09-08: qty 2

## 2016-09-08 MED ORDER — ONDANSETRON HCL 4 MG/2ML IJ SOLN: 4.0000 mg | Freq: Three times a day (TID) | INTRAMUSCULAR | Status: DC | PRN

## 2016-09-08 MED ORDER — DIBUCAINE 1 % RE OINT: 1.0000 "application " | TOPICAL_OINTMENT | RECTAL | Status: DC | PRN

## 2016-09-08 MED ORDER — LACTATED RINGERS IV SOLN
INTRAVENOUS | Status: DC
  Administered 2016-09-08: 11:00:00 via INTRAVENOUS

## 2016-09-08 MED ORDER — FENTANYL CITRATE (PF) 100 MCG/2ML IJ SOLN
INTRAMUSCULAR | Status: DC | PRN
  Administered 2016-09-08: 50 ug via INTRAVENOUS

## 2016-09-08 SURGICAL SUPPLY — 21 items
BLADE SURG 11 STRL SS (BLADE) ×2 IMPLANT
CLIP FILSHIE TUBAL LIGA STRL (Clip) ×2 IMPLANT
CLOTH BEACON ORANGE TIMEOUT ST (SAFETY) ×2 IMPLANT
DRSG OPSITE POSTOP 3X4 (GAUZE/BANDAGES/DRESSINGS) ×2 IMPLANT
DURAPREP 26ML APPLICATOR (WOUND CARE) ×2 IMPLANT
GLOVE BIO SURGEON STRL SZ 6.5 (GLOVE) ×2 IMPLANT
GLOVE BIOGEL PI IND STRL 7.0 (GLOVE) ×2 IMPLANT
GLOVE BIOGEL PI INDICATOR 7.0 (GLOVE) ×2
GOWN STRL REUS W/TWL LRG LVL3 (GOWN DISPOSABLE) ×4 IMPLANT
NEEDLE HYPO 22GX1.5 SAFETY (NEEDLE) ×2 IMPLANT
NS IRRIG 1000ML POUR BTL (IV SOLUTION) ×2 IMPLANT
PACK ABDOMINAL MINOR (CUSTOM PROCEDURE TRAY) ×2 IMPLANT
PROTECTOR NERVE ULNAR (MISCELLANEOUS) ×2 IMPLANT
SPONGE LAP 4X18 X RAY DECT (DISPOSABLE) IMPLANT
SUT VIC AB 0 CT1 27 (SUTURE) ×2
SUT VIC AB 0 CT1 27XBRD ANBCTR (SUTURE) ×1 IMPLANT
SUT VICRYL 4-0 PS2 18IN ABS (SUTURE) ×2 IMPLANT
SYR CONTROL 10ML LL (SYRINGE) ×2 IMPLANT
TOWEL OR 17X24 6PK STRL BLUE (TOWEL DISPOSABLE) ×4 IMPLANT
TRAY FOLEY CATH SILVER 16FR (SET/KITS/TRAYS/PACK) ×2 IMPLANT
WATER STERILE IRR 1000ML POUR (IV SOLUTION) ×2 IMPLANT

## 2016-09-08 NOTE — Progress Notes (Signed)
40 y.o. G2P1011 with undesired fertility desires permanent sterilization. Risks and benefits of  tubal sterilization procedure was discussed with the patient including permanence of method, bleeding, infection, injury to surrounding organs, anesthesia and need for additional procedures. Risk failure of 0.5-1% with increased risk of ectopic gestation if pregnancy occurs was also discussed with patient. Patient verbalized understanding and all questions were answered.  Alvina Filbert Roselie Awkward MD 09/08/2016

## 2016-09-08 NOTE — Transfer of Care (Signed)
Immediate Anesthesia Transfer of Care Note  Patient: Judy Thompson  Procedure(s) Performed: Procedure(s): POST PARTUM TUBAL LIGATION (Bilateral)  Patient Location: PACU  Anesthesia Type:Epidural  Level of Consciousness: awake and alert   Airway & Oxygen Therapy: Patient Spontanous Breathing  Post-op Assessment: Report given to RN and Post -op Vital signs reviewed and stable  Post vital signs: Reviewed  Last Vitals:  Vitals:   09/08/16 1050 09/08/16 1523  BP: (!) 99/59   Pulse: 62 93  Resp: 18   Temp: 36.7 C 36.8 C    Last Pain:  Vitals:   09/08/16 1050  TempSrc: Oral  PainSc: 0-No pain         Complications: No apparent anesthesia complications

## 2016-09-08 NOTE — Anesthesia Postprocedure Evaluation (Signed)
Anesthesia Post Note  Patient: Judy Thompson  Procedure(s) Performed: Procedure(s) (LRB): POST PARTUM TUBAL LIGATION (Bilateral)  Patient location during evaluation: PACU Anesthesia Type: Epidural Level of consciousness: awake and alert Pain management: pain level controlled Vital Signs Assessment: post-procedure vital signs reviewed and stable Respiratory status: spontaneous breathing and respiratory function stable Cardiovascular status: blood pressure returned to baseline and stable Postop Assessment: spinal receding Anesthetic complications: no     Last Vitals:  Vitals:   09/08/16 1615 09/08/16 1630  BP: (!) 91/56 96/60  Pulse: 87 78  Resp: 12 15  Temp:      Last Pain:  Vitals:   09/08/16 1630  TempSrc:   PainSc: 0-No pain   Pain Goal:                 Shaelyn Decarli DANIEL

## 2016-09-08 NOTE — Op Note (Signed)
Judy Thompson 09/07/2016 - 09/08/2016  PREOPERATIVE DIAGNOSIS:  Multiparity, undesired fertility  POSTOPERATIVE DIAGNOSIS:  Multiparity, undesired fertility  PROCEDURE:  Postpartum Bilateral Tubal Sterilization using Filshie Clips   ANESTHESIA:  Epidural  COMPLICATIONS:  None immediate.  ESTIMATED BLOOD LOSS:  Less than 20 ml.  URINE OUTPUT:  1000 ml of clear urine.  INDICATIONS: 40 y.o. G2P1011  with undesired fertility,status post vaginal delivery, desires permanent sterilization. Risks and benefits of procedure discussed with patient including permanence of method, bleeding, infection, injury to surrounding organs and need for additional procedures. Risk failure of 0.5-1% with increased risk of ectopic gestation if pregnancy occurs was also discussed with patient.   FINDINGS:  Normal uterus, tubes, and ovaries.  TECHNIQUE:  The patient was taken to the operating room where her epidural anesthesia was dosed up to surgical level and found to be adequate.  She was then placed in the dorsal supine position and prepped and draped in sterile fashion.  After an adequate timeout was performed, attention was turned to the patient's abdomen where a small transverse skin incision was made under the umbilical fold. The incision was taken down to the layer of fascia using the scalpel, and fascia was incised, and extended bilaterally using Mayo scissors. The peritoneum was entered in a sharp fashion. Attention was then turned to the patient's uterus, and left fallopian tube was identified and followed out to the fimbriated end.  A Filshie clip was placed on the left fallopian tube about 2 cm from the cornual attachment, with care given to incorporate the underlying mesosalpinx.  A similar process was carried out on the rightl side allowing for bilateral tubal sterilization.  Good hemostasis was noted overall.  Local analgesia was drizzled on both operative sites.The instruments were then removed from  the patient's abdomen and the fascial incision was repaired with 0 Vicryl, and the skin was closed with a 3-0 Monocryl subcuticular stitch. The patient tolerated the procedure well.  Sponge, lap, and needle counts were correct times two.  The patient was then taken to the recovery room awake, extubated and in stable condition.  Jacquiline Doe, MD 09/08/2016 [phone]

## 2016-09-08 NOTE — Anesthesia Preprocedure Evaluation (Signed)
Anesthesia Evaluation  Patient identified by MRN, date of birth, ID band Patient awake    Reviewed: Allergy & Precautions, NPO status , Patient's Chart, lab work & pertinent test results  Airway Mallampati: II  TM Distance: >3 FB Neck ROM: Full    Dental no notable dental hx. (+) Dental Advisory Given   Pulmonary neg pulmonary ROS,    Pulmonary exam normal        Cardiovascular negative cardio ROS Normal cardiovascular exam     Neuro/Psych negative neurological ROS  negative psych ROS   GI/Hepatic negative GI ROS, Neg liver ROS,   Endo/Other  negative endocrine ROS  Renal/GU negative Renal ROS  negative genitourinary   Musculoskeletal negative musculoskeletal ROS (+)   Abdominal   Peds negative pediatric ROS (+)  Hematology negative hematology ROS (+)   Anesthesia Other Findings   Reproductive/Obstetrics                             Anesthesia Physical  Anesthesia Plan  ASA: II  Anesthesia Plan: Epidural   Post-op Pain Management:    Induction:   Airway Management Planned:   Additional Equipment:   Intra-op Plan:   Post-operative Plan:   Informed Consent: I have reviewed the patients History and Physical, chart, labs and discussed the procedure including the risks, benefits and alternatives for the proposed anesthesia with the patient or authorized representative who has indicated his/her understanding and acceptance.   Dental advisory given  Plan Discussed with: Anesthesiologist and CRNA  Anesthesia Plan Comments:         Anesthesia Quick Evaluation

## 2016-09-08 NOTE — Lactation Note (Signed)
This note was copied from a baby's chart. Lactation Consultation Note  Patient Name: Judy Thompson Today's Date: 09/08/2016 Reason for consult: Initial assessment Baby at 62 hr of life. Mom reports bf is going well. She denies breast or nipple pain, voiced no concerns. She had a room full of visitors. Briefly went of bf ed and left handouts. She is aware of lactation services and support group. She will call at the next feeding.   Maternal Data Does the patient have breastfeeding experience prior to this delivery?: No  Feeding Feeding Type: Breast Fed Length of feed: 10 min  LATCH Score/Interventions Latch: Grasps breast easily, tongue down, lips flanged, rhythmical sucking. Intervention(s): Adjust position;Assist with latch;Breast massage;Breast compression  Audible Swallowing: None Intervention(s): Skin to skin Intervention(s): Skin to skin  Type of Nipple: Everted at rest and after stimulation  Comfort (Breast/Nipple): Soft / non-tender     Hold (Positioning): Assistance needed to correctly position infant at breast and maintain latch. Intervention(s): Breastfeeding basics reviewed;Support Pillows;Position options;Skin to skin  LATCH Score: 7  Lactation Tools Discussed/Used     Consult Status Consult Status: Follow-up Date: 09/09/16 Follow-up type: In-patient    Denzil Hughes 09/08/2016, 7:47 PM

## 2016-09-08 NOTE — Progress Notes (Signed)
Operative Delivery Note At  a viable and healthy female was delivered via .  Presentation: vertex; Position: Occiput,, Anterior; Station: +4.  Verbal consent: obtained from patient.  Risks and benefits discussed in detail.  Risks include, but are not limited to the risks of anesthesia, bleeding, infection, damage to maternal tissues, fetal cephalhematoma.  There is also the risk of inability to effect vaginal delivery of the head, or shoulder dystocia that cannot be resolved by established maneuvers, leading to the need for emergency cesarean section.  APGAR:9/9 , ; weight  .   Placenta status: , . schultz  Cord:  with the following complications: .  Cord pH: not done  Anesthesia:   Instruments: Kiwi Episiotomy:   Lacerations:   Suture Repair: 2.0 Monocryl Est. Blood Loss (mL):    Mom to postpartum.  Baby to Couplet care / Skin to Skin. Mother again confirms her desire for permanent sterilization. Jonnie Kind 09/08/2016, 3:22 AM  Patient ID: Judy Thompson, female   DOB: 1976-05-11, 40 y.o.   MRN: [id number]

## 2016-09-09 ENCOUNTER — Encounter (HOSPITAL_COMMUNITY): Payer: Self-pay | Admitting: Obstetrics & Gynecology

## 2016-09-09 LAB — RH IG WORKUP (INCLUDES ABO/RH)
ABO/RH(D): O NEG
Fetal Screen: NEGATIVE
Gestational Age(Wks): 40
Unit division: 0

## 2016-09-09 NOTE — Anesthesia Postprocedure Evaluation (Signed)
Anesthesia Post Note  Patient: Judy Thompson  Procedure(s) Performed: * No procedures listed *  Patient location during evaluation: Mother Baby Anesthesia Type: Epidural Level of consciousness: awake Pain management: pain level controlled Vital Signs Assessment: post-procedure vital signs reviewed and stable Cardiovascular status: stable Postop Assessment: no headache, no backache, epidural receding and patient able to bend at knees Anesthetic complications: no     Last Vitals:  Vitals:   09/09/16 0015 09/09/16 0450  BP: 97/71 (!) 92/47  Pulse: 75 72  Resp: 18 18  Temp: 36.7 C 36.6 C    Last Pain:  Vitals:   09/09/16 1216  TempSrc:   PainSc: 3    Pain Goal: Patients Stated Pain Goal: 0 (09/08/16 2210)               Muadh Creasy JR,JOHN Mateo Flow

## 2016-09-09 NOTE — Progress Notes (Signed)
Mom feeling better. No C/O nausea

## 2016-09-09 NOTE — Progress Notes (Signed)
Patient ID: Judy Thompson, female   DOB: 06/13/1976, 39 y.o.   MRN: [id number]  POSTPARTUM PROGRESS NOTE  Post Partum Day #1/POD#1  Subjective:  Judy Thompson is a 41 y.o. G2P1011 [redacted]w[redacted]d s/p NSVD with postpartum BTL.  No acute events overnight.  Pt denies problems with ambulating, voiding or po intake.  She denies nausea or vomiting.  Pain is well controlled.  She has had flatus. She has not had bowel movement.  Lochia Small.   Objective: Blood pressure (!) 92/47, pulse 72, temperature 97.8 F (36.6 C), temperature source Oral, resp. rate 18, height 5\' 7"  (1.702 m), weight 175 lb (79.4 kg), SpO2 100 %, unknown if currently breastfeeding.  Physical Exam:  General: alert, cooperative and no distress Lochia:normal flow Chest: CTAB Heart: RRR no m/r/g Abdomen: +BS, soft, nontender, incision at umbilicus is clean, dry, intact, with honeycomb dressing Uterine Fundus: firm, below umbilicus DVT Evaluation: No calf swelling or tenderness Extremities: Trace edema   Recent Labs  09/07/16 1130  HGB 12.0  HCT 32.9*    Assessment/Plan:  ASSESSMENT: Judy Thompson is a 40 y.o. G2P1011 [redacted]w[redacted]d s/p NSVD with postpartum BTL.  Plan for discharge tomorrow, Breastfeeding and Contraception BTL  Baby is currently in the NICU for heart concerns, will keep mother until tomorrow or d/c today if baby has to be transferred out to another facility.   LOS: 2 days   Katherine Basset, DO [place] for [facility], [facility]  09/09/2016, 8:45 AM

## 2016-09-09 NOTE — Anesthesia Postprocedure Evaluation (Signed)
Anesthesia Post Note  Patient: Judy Thompson  Procedure(s) Performed: Procedure(s) (LRB): POST PARTUM TUBAL LIGATION (Bilateral)  Patient location during evaluation: Mother Baby Anesthesia Type: Epidural Level of consciousness: awake and alert Pain management: pain level controlled Vital Signs Assessment: post-procedure vital signs reviewed and stable Respiratory status: spontaneous breathing and nonlabored ventilation Cardiovascular status: stable Postop Assessment: no headache, patient able to bend at knees, no backache, no signs of nausea or vomiting, epidural receding and adequate PO intake Anesthetic complications: no     Last Vitals:  Vitals:   09/09/16 0015 09/09/16 0450  BP: 97/71 (!) 92/47  Pulse: 75 72  Resp: 18 18  Temp: 36.7 C 36.6 C    Last Pain:  Vitals:   09/09/16 0450  TempSrc: Oral  PainSc: 4    Pain Goal: Patients Stated Pain Goal: 0 (09/08/16 2210)               Perley Arthurs Hristova

## 2016-09-09 NOTE — Addendum Note (Signed)
Addendum  created 09/09/16 [id number] by Hewitt Blade, CRNA   Sign clinical note

## 2016-09-09 NOTE — Progress Notes (Signed)
Pt nauseaus, pt given 4gm zofran PO and 80mg  simethicone for gas.

## 2016-09-10 ENCOUNTER — Encounter (HOSPITAL_COMMUNITY): Payer: Self-pay | Admitting: Obstetrics & Gynecology

## 2016-09-10 MED ORDER — SENNOSIDES-DOCUSATE SODIUM 8.6-50 MG PO TABS: 2.0000 | ORAL_TABLET | Freq: Every day | ORAL | 1 refills | Status: DC

## 2016-09-10 MED ORDER — IBUPROFEN 600 MG PO TABS: 600.0000 mg | ORAL_TABLET | Freq: Four times a day (QID) | ORAL | 1 refills | Status: DC

## 2016-09-10 MED ORDER — OXYCODONE HCL 5 MG PO TABS: 5.0000 mg | ORAL_TABLET | ORAL | 0 refills | Status: DC | PRN

## 2016-09-10 NOTE — Lactation Note (Signed)
This note was copied from a baby's chart. Lactation Consultation Note F/u for weight loss. Baby has large output after birth. Has been very spitty. Baby is BF much better and parents feels things are going well now.. Encouraged strict I&O. Mom states BF going well. Patient Name: Judy Thompson Today's Date: 09/10/2016 Reason for consult: Follow-up assessment;Infant weight loss   Maternal Data    Feeding Feeding Type: Breast Fed Length of feed: 20 min  LATCH Score/Interventions                      Lactation Tools Discussed/Used     Consult Status Consult Status: Follow-up Date: 09/10/16 Follow-up type: In-patient    Miana Politte, Elta Guadeloupe 09/10/2016, 4:03 AM

## 2016-09-10 NOTE — Progress Notes (Signed)
Subjective: Postpartum Day #2: Vaginal Delivery with Vaccum with BTL Patient reports incisional pain, tolerating PO and no problems voiding.    Judy Thompson is a 40 y.o. [id number] [redacted]w[redacted]d s/p NSVD with postpartum BTL.  No acute events overnight.  Pt denies problems with ambulating, voiding or po intake.  She denies nausea or vomiting.  Pain is well controlled.  She has had flatus. She has not had bowel movement.  Lochia Small.   Objective: Vital signs in last 24 hours: Temp:  [97.9 F (36.6 C)-98.9 F (37.2 C)] 97.9 F (36.6 C) (12/18 0549) Pulse Rate:  [60-74] 60 (12/18 0549) Resp:  [16-18] 16 (12/18 0549) BP: (99-102)/(51-62) 102/62 (12/18 0549)  Physical Exam:  General: alert, cooperative and no distress Lochia: appropriate Uterine Fundus: firm Incision: no significant drainage, no dehiscence, no significant erythema DVT Evaluation: No evidence of DVT seen on physical exam. No cords or calf tenderness. No significant calf/ankle edema.   Recent Labs  09/07/16 1130  HGB 12.0  HCT 32.9*    Assessment/Plan: Status VAVD and BTL. Doing well postoperatively. RH negative, Rhogam given 09/08/16.   Discharge home with standard precautions and return to clinic in 4-6 weeks.  Morene Crocker, CNM 09/10/2016, 7:27 AM

## 2016-09-10 NOTE — Discharge Summary (Signed)
Obstetric Discharge Summary Reason for Admission: onset of labor and at [redacted]w[redacted]d Prenatal Procedures: ultrasound Intrapartum Procedures: spontaneous vaginal delivery and vacuum Postpartum Procedures: Rho(D) Ig Complications-Operative and Postpartum: 2nd degree perineal laceration Hemoglobin  Date Value Ref Range Status  09/07/2016 12.0 12.0 - 15.0 g/dL Final   HCT  Date Value Ref Range Status  09/07/2016 32.9 (L) 36.0 - 46.0 % Final   Hematocrit  Date Value Ref Range Status  06/05/2016 32.6 (L) 34.0 - 46.6 % Final    Physical Exam:  General: alert, cooperative and no distress Lochia: appropriate Uterine Fundus: firm Incision: no significant drainage, no dehiscence, no significant erythema DVT Evaluation: No evidence of DVT seen on physical exam. No cords or calf tenderness. No significant calf/ankle edema.  Discharge Diagnoses: Term Pregnancy-delivered and BTL  Discharge Information: Date: 09/10/2016 Activity: pelvic rest Diet: routine Medications: PNV, Ibuprofen, Colace and Percocet Condition: stable Instructions: refer to practice specific booklet Discharge to: home Follow-up [place]. Schedule an appointment as soon as possible for a visit in 4 week(s).   Contact information: [place] [id number] [phone]          Newborn Data: Live born female  Birth Weight: 7 lb 9.5 oz (3445 g) APGAR: 8, 9  Home with mother.  Morene Crocker, CNM 09/10/2016, 7:33 AM

## 2016-09-11 LAB — TYPE AND SCREEN
Blood Product Expiration Date: 201712282359
Blood Product Expiration Date: 201712312359
Unit Type and Rh: 9500
Unit Type and Rh: 9500

## 2016-09-12 ENCOUNTER — Inpatient Hospital Stay (HOSPITAL_COMMUNITY): Payer: BLUE CROSS/BLUE SHIELD

## 2016-09-18 DIAGNOSIS — Z6824 Body mass index (BMI) 24.0-24.9, adult: Secondary | ICD-10-CM | POA: Diagnosis not present

## 2016-09-18 DIAGNOSIS — H6590 Unspecified nonsuppurative otitis media, unspecified ear: Secondary | ICD-10-CM | POA: Diagnosis not present

## 2016-09-18 DIAGNOSIS — Z1389 Encounter for screening for other disorder: Secondary | ICD-10-CM | POA: Diagnosis not present

## 2016-09-24 HISTORY — PX: BREAST BIOPSY: SHX20

## 2016-10-11 ENCOUNTER — Ambulatory Visit: Payer: BLUE CROSS/BLUE SHIELD | Admitting: Women's Health

## 2016-10-13 ENCOUNTER — Other Ambulatory Visit: Payer: Self-pay | Admitting: Certified Nurse Midwife

## 2016-10-16 ENCOUNTER — Encounter: Payer: Self-pay | Admitting: Women's Health

## 2016-10-16 ENCOUNTER — Ambulatory Visit (INDEPENDENT_AMBULATORY_CARE_PROVIDER_SITE_OTHER): Payer: BLUE CROSS/BLUE SHIELD | Admitting: Women's Health

## 2016-10-16 DIAGNOSIS — M545 Low back pain, unspecified: Secondary | ICD-10-CM

## 2016-10-16 NOTE — Progress Notes (Signed)
Subjective:    Judy Thompson is a 41 y.o. G48P1011 Caucasian female who presents for a postpartum visit. She is 5 weeks postpartum following a vacuum, low at 40.3 gestational weeks. Anesthesia: epidural. I have fully reviewed the prenatal and intrapartum course. Postpartum course has been . Baby's course has been complicated by low back pain- requests pain meds. Baby is feeding by breast. Bleeding no bleeding. Bowel function is normal. Bladder function is normal. Patient is not sexually active. Last sexual activity: prior to birth of baby. Contraception method is s/p BTL. Postpartum depression screening: negative. Score 2.  Last pap 2016 and was neg @ [place].  The following portions of the patient's history were reviewed and updated as appropriate: allergies, current medications, past medical history, past surgical history and problem list.  Review of Systems Pertinent items are noted in HPI.   Vitals:   10/16/16 1558  BP: (!) 102/52  Pulse: 74  Weight: 159 lb (72.1 kg)  Height: 5\' 7"  (1.702 m)   No LMP recorded.  Objective:   General:  alert, cooperative and no distress   Breasts:  deferred, no complaints  Lungs: clear to auscultation bilaterally  Heart:  regular rate and rhythm  Abdomen: soft, nontender, BTL incision well-healed   Vulva: normal  Vagina: normal vagina  Cervix:  closed  Corpus: Well-involuted  Adnexa:  Non-palpable  Rectal Exam: No hemorrhoids       Back: some tenderness low mid back Assessment:   Postpartum exam 5 wks s/p SVB Breastfeeding LBP Depression screening Contraception counseling   Plan:  Contraception: s/p BTL  Discussed and gave printed info/relief measures for LBP, declined request for refill on oxycodone If back continues to hurt, can make appt w/ [facility] for possible injection if deemed necessary Follow up in: 6 months for physical or earlier if needed  Tawnya Crook CNM, WHNP-BC 10/16/2016 4:20 PM

## 2016-10-16 NOTE — Patient Instructions (Signed)
Tips To Increase Milk Supply  Lots of water! Enough so that your urine is clear  Plenty of calories, if you're not getting enough calories, your milk supply can decrease  Breastfeed/pump often, every 2-3 hours x 20-33mins  Fenugreek 3 pills 3 times a day, this may make your urine smell like maple syrup  Mother's Milk Tea  Lactation cookies, google for the recipe  Real oatmeal   For your lower back pain you may:  Take warm baths  Use a heating pad to your lower back for no longer than 20 minutes at a time  Can try ice  Take tylenol as needed or ibuprofen. Please follow directions on the bottle  Use good posture  Back rubs/massages

## 2016-11-12 ENCOUNTER — Other Ambulatory Visit: Payer: Self-pay | Admitting: Certified Nurse Midwife

## 2017-04-15 ENCOUNTER — Other Ambulatory Visit: Payer: BLUE CROSS/BLUE SHIELD | Admitting: Adult Health

## 2017-07-24 ENCOUNTER — Ambulatory Visit (INDEPENDENT_AMBULATORY_CARE_PROVIDER_SITE_OTHER): Payer: BLUE CROSS/BLUE SHIELD | Admitting: Advanced Practice Midwife

## 2017-07-24 ENCOUNTER — Encounter: Payer: Self-pay | Admitting: Advanced Practice Midwife

## 2017-07-24 VITALS — BP 100/70 | HR 97 | Wt 159.0 lb

## 2017-07-24 DIAGNOSIS — N6310 Unspecified lump in the right breast, unspecified quadrant: Secondary | ICD-10-CM | POA: Diagnosis not present

## 2017-07-24 DIAGNOSIS — N6315 Unspecified lump in the right breast, overlapping quadrants: Secondary | ICD-10-CM

## 2017-07-24 NOTE — Patient Instructions (Signed)
Mammogram/breast Ultrasound at [facility] on August 05, 2017 at 9 am.  Arrive at 8:50  No powder or Deoderant [phone].  Marland Kitchen

## 2017-07-24 NOTE — Progress Notes (Signed)
[facility] Visit  Patient name: Judy Thompson MRN [id number]  Date of birth: [birth year]  CC & HPI:  Judy Thompson is a 41 y.o. Caucasian female presenting today for evaluation of breast lump. Husband noticed it about 3 mo nths ago, says it is unusual for her.  Pt was unaware, but also feels like it is a change in her breast tissue.    Pertinent History Reviewed:  Medical & Surgical Hx:   Past Medical History:  Diagnosis Date  . Medical history non-contributory    Past Surgical History:  Procedure Laterality Date  . NO PAST SURGERIES    . TUBAL LIGATION Bilateral 09/08/2016   Procedure: POST PARTUM TUBAL LIGATION;  Surgeon: Woodroe Mode, MD;  Location: [facility];  Service: Gynecology;  Laterality: Bilateral;   Family History  Problem Relation Age of Onset  . Hypertension Mother   . Cancer Mother        breast, colon  . Heart disease Father   . Diabetes Paternal Grandmother   . Hypertension Paternal Grandmother     Current Outpatient Prescriptions:  .  ibuprofen (ADVIL,MOTRIN) 600 MG tablet, TAKE 1 TABLET (600 MG TOTAL) BY MOUTH EVERY 6 (SIX) HOURS., Disp: 30 tablet, Rfl: 5 .  acetaminophen (TYLENOL) 500 MG tablet, Take 500 mg by mouth every 6 (six) hours as needed for mild pain. Reported on 03/21/2016, Disp: , Rfl:  .  levocetirizine (XYZAL) 5 MG tablet, Take 5 mg by mouth daily., Disp: , Rfl: 11 .  oxyCODONE (OXY IR/ROXICODONE) 5 MG immediate release tablet, Take 1 tablet (5 mg total) by mouth every 4 (four) hours as needed (pain scale 4-7). (Patient not taking: Reported on 10/16/2016), Disp: 30 tablet, Rfl: 0 .  Prenatal Vit-Fe Fumarate-FA (PRENATAL VITAMIN PO), Take by mouth daily., Disp: , Rfl:  .  senna-docusate (SENOKOT-S) 8.6-50 MG tablet, Take 2 tablets by mouth at bedtime. (Patient not taking: Reported on 10/16/2016), Disp: 45 tablet, Rfl: 1 Social History: Reviewed -  reports that she has never smoked. She has never used smokeless tobacco.  Review of  Systems:   Constitutional: Negative for fever and chills Eyes: Negative for visual disturbances Respiratory: Negative for shortness of breath, dyspnea Cardiovascular: Negative for chest pain or palpitations  Gastrointestinal: Negative for vomiting, diarrhea and constipation; no abdominal pain Genitourinary: Negative for dysuria and urgency, vaginal irritation or itching Musculoskeletal: Negative for back pain, joint pain, myalgias  Neurological: Negative for dizziness and headaches    Objective Findings:    Physical Examination: General appearance - well appearing, and in no distress Mental status - alert, oriented to person, place, and time Chest:  Normal respiratory effort Breast:  Right breast with 5cm firm, mobile, nontender nodule under right nipple.  Left breast neg Heart - normal rate and regular rhythm Abdomen:  Soft, nontender Pelvic: deferred Musculoskeletal:  Normal range of motion without pain Extremities:  No edema    No results found for this or any previous visit (from the past 24 hour(s)).    Assessment & Plan:  A:   Right breast firmness P:   Orders Placed This Encounter  Procedures  . US BREAST LTD UNI RIGHT INC AXILLA    Standing Status:   Future    Standing Expiration Date:   09/23/2018    Order Specific Question:   Reason for Exam (SYMPTOM  OR DIAGNOSIS REQUIRED)    Answer:   5cm firm nodule right breast    Order Specific Question:  Preferred imaging location?    Answer:   [facility]    Standing Status:   Future    Standing Expiration Date:   09/23/2018    Order Specific Question:   Reason for Exam (SYMPTOM  OR DIAGNOSIS REQUIRED)    Answer:   5cm firm nodule right breast    Order Specific Question:   Is the patient pregnant?    Answer:   No    Order Specific Question:   Preferred imaging location?    Answer:   [facility]      No Follow-up on file.  CRESENZO-DISHMAN,Shantana Christon  CNM 07/24/2017 11:13 AM

## 2017-08-05 ENCOUNTER — Other Ambulatory Visit: Payer: Self-pay | Admitting: Advanced Practice Midwife

## 2017-08-05 ENCOUNTER — Encounter (HOSPITAL_COMMUNITY): Payer: Self-pay

## 2017-08-05 ENCOUNTER — Ambulatory Visit (HOSPITAL_COMMUNITY)
Admission: RE | Admit: 2017-08-05 | Discharge: 2017-08-05 | Disposition: A | Discharge status: Home / Self Care | Payer: BLUE CROSS/BLUE SHIELD | Source: Ambulatory Visit | Attending: Advanced Practice Midwife | Admitting: Advanced Practice Midwife

## 2017-08-05 DIAGNOSIS — N6310 Unspecified lump in the right breast, unspecified quadrant: Principal | ICD-10-CM

## 2017-08-05 DIAGNOSIS — N6311 Unspecified lump in the right breast, upper outer quadrant: Secondary | ICD-10-CM | POA: Insufficient documentation

## 2017-08-05 DIAGNOSIS — R922 Inconclusive mammogram: Secondary | ICD-10-CM | POA: Diagnosis not present

## 2017-08-05 DIAGNOSIS — N6315 Unspecified lump in the right breast, overlapping quadrants: Secondary | ICD-10-CM

## 2017-08-05 DIAGNOSIS — N6489 Other specified disorders of breast: Secondary | ICD-10-CM | POA: Diagnosis not present

## 2017-08-05 DIAGNOSIS — N6312 Unspecified lump in the right breast, upper inner quadrant: Secondary | ICD-10-CM | POA: Diagnosis not present

## 2017-08-13 ENCOUNTER — Ambulatory Visit (HOSPITAL_COMMUNITY)
Admission: RE | Admit: 2017-08-13 | Discharge: 2017-08-13 | Disposition: A | Discharge status: Home / Self Care | Payer: BLUE CROSS/BLUE SHIELD | Source: Ambulatory Visit | Attending: Advanced Practice Midwife | Admitting: Advanced Practice Midwife

## 2017-08-13 ENCOUNTER — Other Ambulatory Visit (HOSPITAL_COMMUNITY): Payer: Self-pay | Admitting: Advanced Practice Midwife

## 2017-08-13 DIAGNOSIS — N6031 Fibrosclerosis of right breast: Secondary | ICD-10-CM | POA: Insufficient documentation

## 2017-08-13 DIAGNOSIS — N6310 Unspecified lump in the right breast, unspecified quadrant: Secondary | ICD-10-CM

## 2017-08-13 DIAGNOSIS — N6311 Unspecified lump in the right breast, upper outer quadrant: Secondary | ICD-10-CM | POA: Diagnosis not present

## 2017-08-13 DIAGNOSIS — N6315 Unspecified lump in the right breast, overlapping quadrants: Secondary | ICD-10-CM

## 2017-08-13 DIAGNOSIS — N6341 Unspecified lump in right breast, subareolar: Secondary | ICD-10-CM | POA: Diagnosis not present

## 2017-08-13 MED ORDER — LIDOCAINE HCL (PF) 1 % IJ SOLN
INTRAMUSCULAR | Status: AC
  Administered 2017-08-13: 5 mL
  Filled 2017-08-13: qty 5

## 2017-08-13 MED ORDER — SODIUM BICARBONATE 4 % IV SOLN
INTRAVENOUS | Status: AC
  Administered 2017-08-13: 5 mL
  Filled 2017-08-13: qty 5

## 2017-08-13 MED ORDER — LIDOCAINE-EPINEPHRINE (PF) 1 %-1:200000 IJ SOLN
INTRAMUSCULAR | Status: AC
  Administered 2017-08-13: 30 mL
  Filled 2017-08-13: qty 30

## 2017-09-12 DIAGNOSIS — Z1389 Encounter for screening for other disorder: Secondary | ICD-10-CM | POA: Diagnosis not present

## 2017-09-12 DIAGNOSIS — Z23 Encounter for immunization: Secondary | ICD-10-CM | POA: Diagnosis not present

## 2017-09-12 DIAGNOSIS — J019 Acute sinusitis, unspecified: Secondary | ICD-10-CM | POA: Diagnosis not present

## 2017-09-12 DIAGNOSIS — G43909 Migraine, unspecified, not intractable, without status migrainosus: Secondary | ICD-10-CM | POA: Diagnosis not present

## 2017-09-12 DIAGNOSIS — Z6824 Body mass index (BMI) 24.0-24.9, adult: Secondary | ICD-10-CM | POA: Diagnosis not present

## 2018-02-28 IMAGING — US US OB COMP LESS 14 WK
1 series · 13 of 28 positions shown · non-contrast
Comparison: None.

CLINICAL DATA: Vaginal bleeding since last night. Estimated
gestational age by LMP is 7 weeks 0 days. Quantitative beta HCG is
[DATE].

EXAM:
OBSTETRIC <14 WK US AND TRANSVAGINAL OB US
TECHNIQUE: Both transabdominal and transvaginal ultrasound examinations were
performed for complete evaluation of the gestation as well as the
maternal uterus, adnexal regions, and pelvic cul-de-sac.
Transvaginal technique was performed to assess early pregnancy.

[Series 1: us ob comp less 14 wk · 0.26mm/px · 13 of 76 slices shown]
[im 3/76]
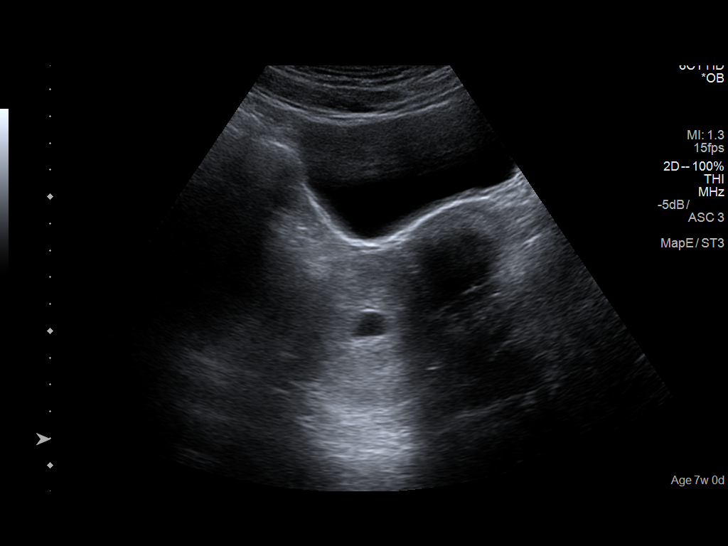
[im 9/76]
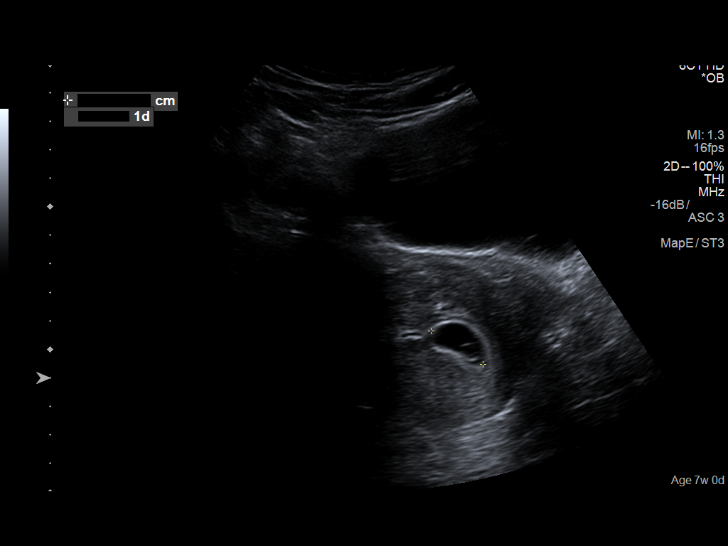
[im 14/76]
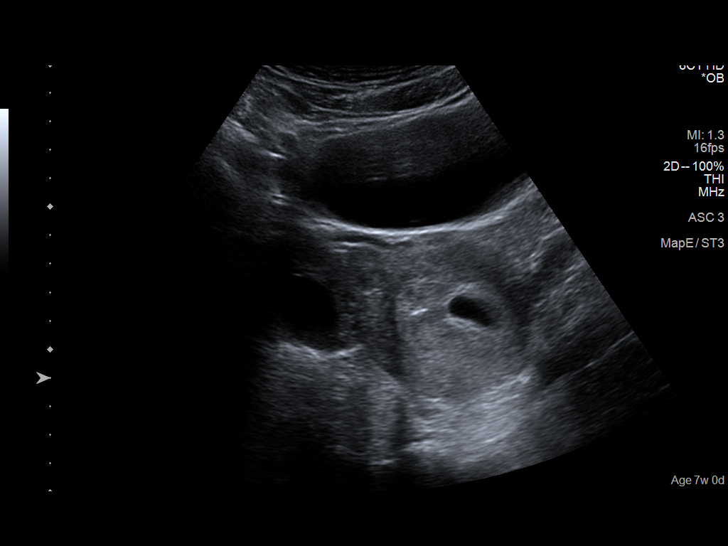
[im 20/76]
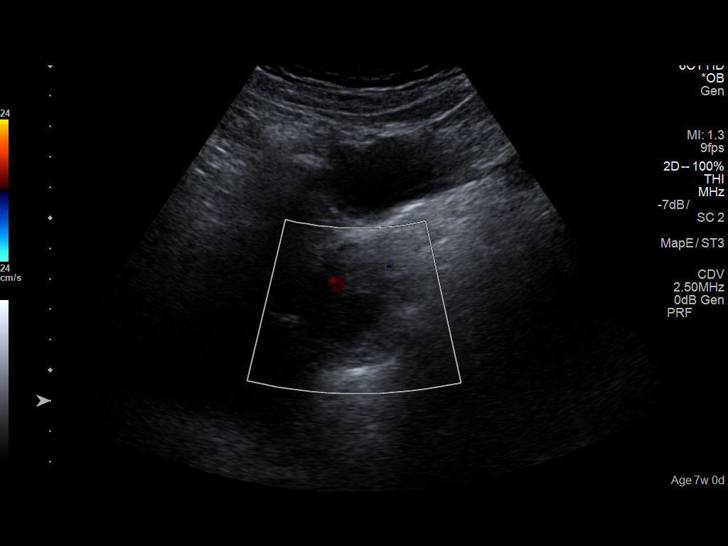
[im 26/76]
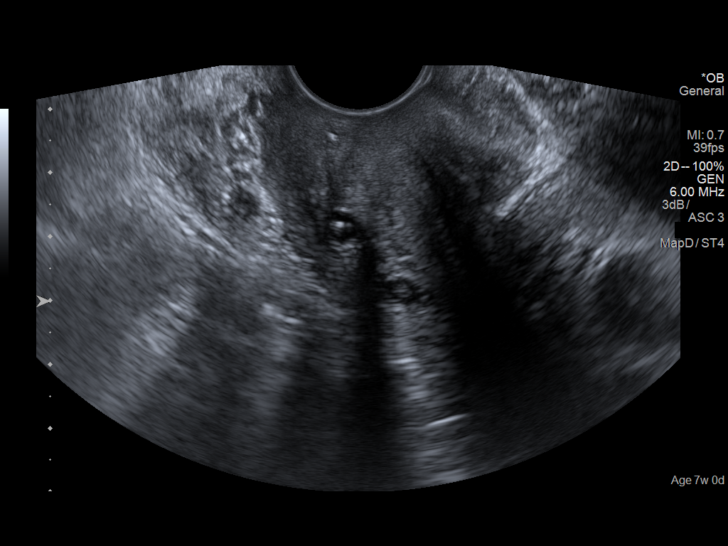
[im 31/76]
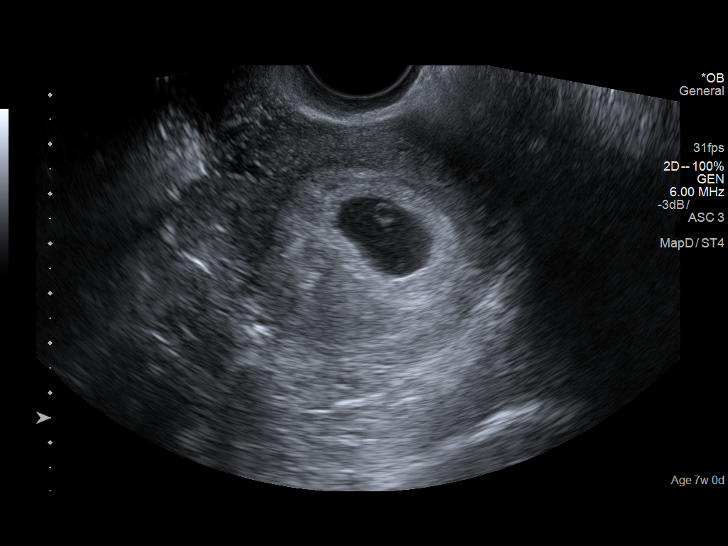
[im 39/76]
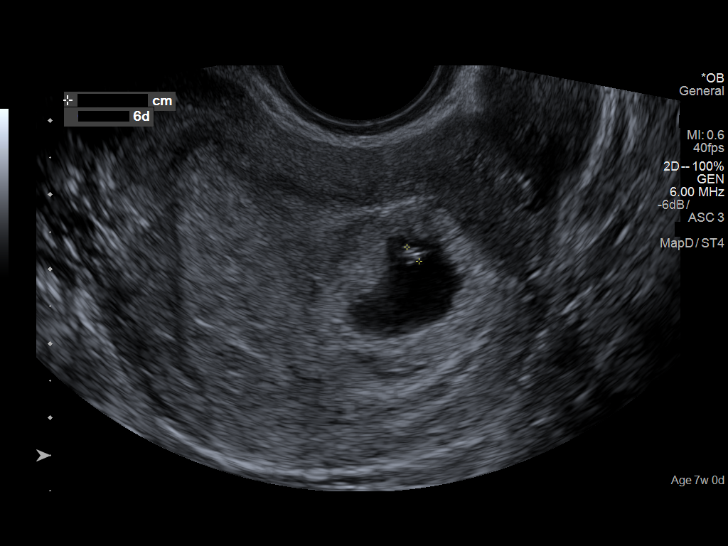
[im 45/76]
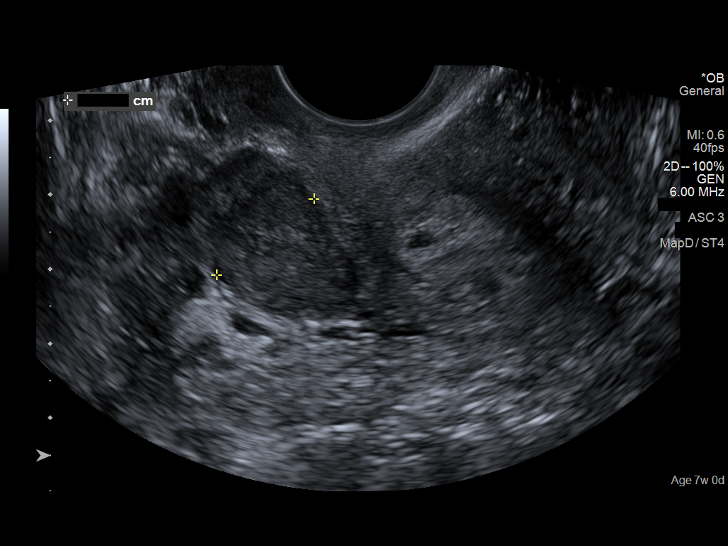
[im 51/76]
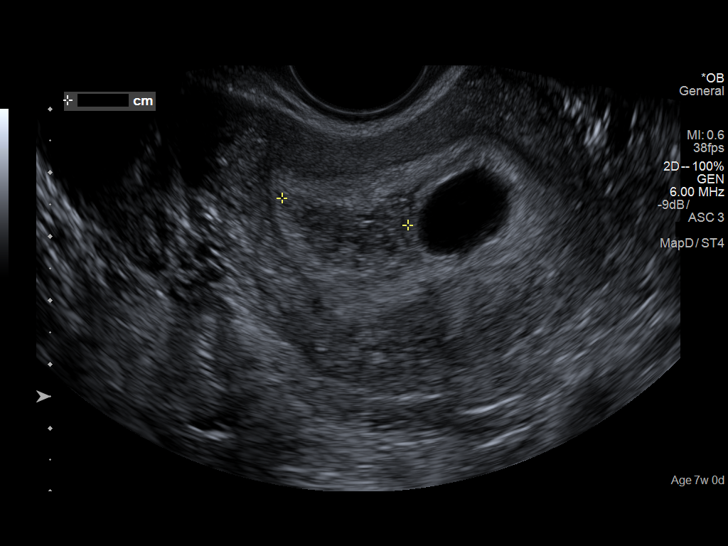
[im 56/76]
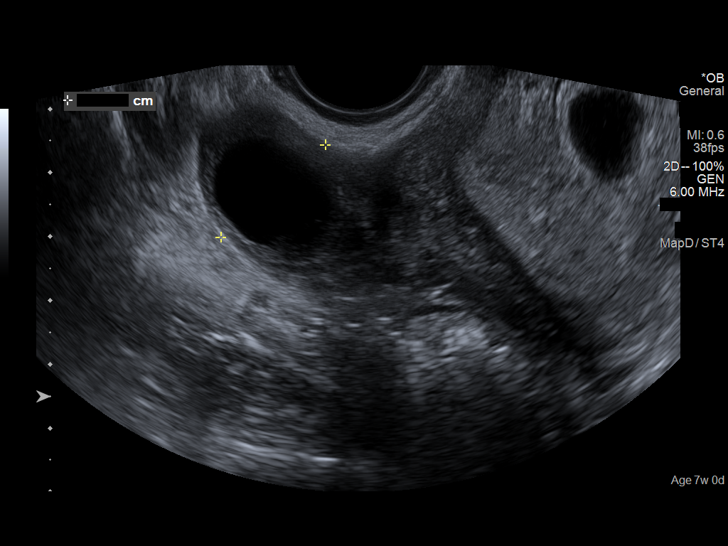
[im 62/76]
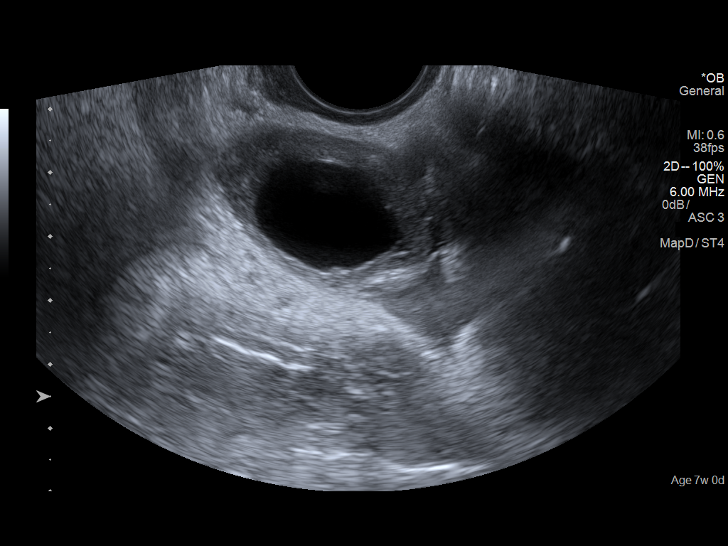
[im 67/76]
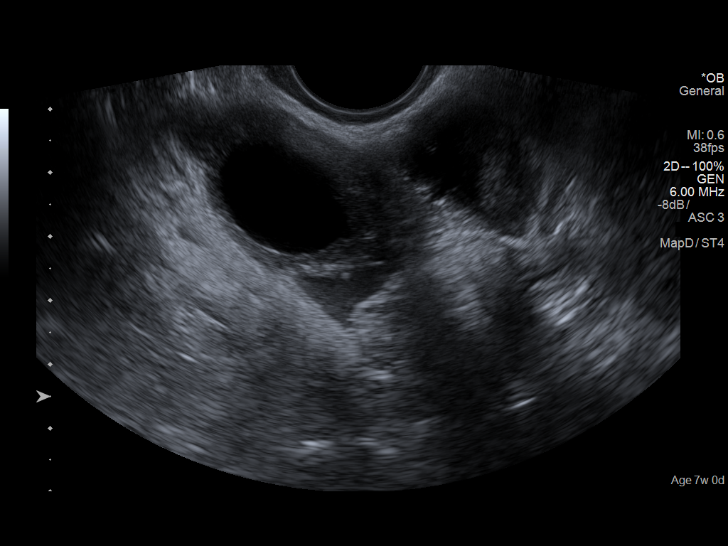
[im 73/76]
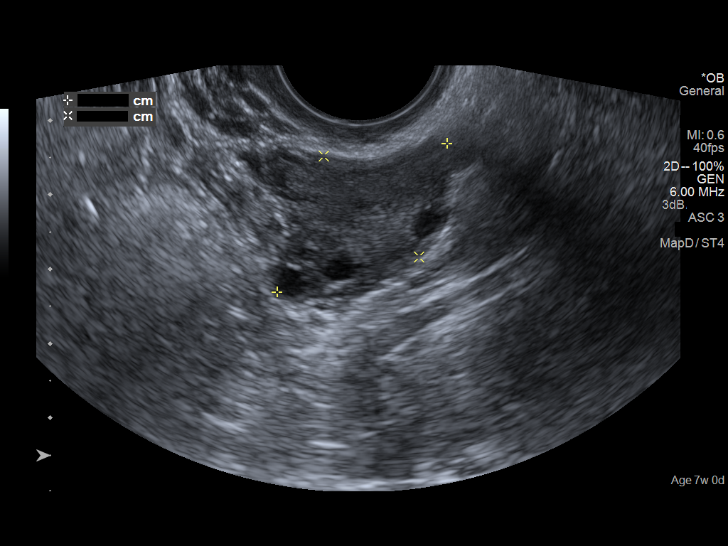

[13 of 28 positions shown; findings below may reference images not displayed]

FINDINGS: Intrauterine gestational sac: A single intrauterine gestational sac
is identified.

Yolk sac:  Yolk sac is present.

Embryo:  A single fetal pole is identified.

Cardiac Activity: Fetal cardiac activity is observed.

Heart Rate: Not able to measure due to small size.

CRL:  2.9  mm   5 w   6 d                  US EDC: 09/05/2016

Subchorionic hemorrhage: A small subchorionic hemorrhage is
demonstrated, measuring 1.5 x 2.6 x 2 point 0 cm.

Maternal uterus/adnexae: The uterus is retroverted. An exophytic
fibroid is demonstrated posteriorly, measuring 2.4 cm maximal
diameter. Both ovaries are visualized. Normal follicular changes on
the left. Corpus luteal cyst on the right. No abnormal adnexal
masses. Small amount of free fluid in the pelvis.
IMPRESSION: Single intrauterine pregnancy demonstrated. Estimated gestational
age by crown-rump length is 5 weeks 6 days. Small subchorionic
hemorrhage. Incidental note of a small uterine fibroid.

## 2018-07-04 DIAGNOSIS — R05 Cough: Secondary | ICD-10-CM | POA: Diagnosis not present

## 2018-07-04 DIAGNOSIS — J3089 Other allergic rhinitis: Secondary | ICD-10-CM | POA: Diagnosis not present

## 2018-07-04 DIAGNOSIS — Z1389 Encounter for screening for other disorder: Secondary | ICD-10-CM | POA: Diagnosis not present

## 2018-07-04 DIAGNOSIS — Z6824 Body mass index (BMI) 24.0-24.9, adult: Secondary | ICD-10-CM | POA: Diagnosis not present

## 2018-07-07 ENCOUNTER — Ambulatory Visit: Payer: BLUE CROSS/BLUE SHIELD | Admitting: Women's Health

## 2018-07-07 ENCOUNTER — Other Ambulatory Visit: Payer: Self-pay | Admitting: Women's Health

## 2018-07-07 ENCOUNTER — Other Ambulatory Visit: Payer: Self-pay

## 2018-07-07 ENCOUNTER — Encounter: Payer: Self-pay | Admitting: Women's Health

## 2018-07-07 VITALS — BP 107/71 | HR 74 | Ht 67.0 in | Wt 156.0 lb

## 2018-07-07 DIAGNOSIS — N898 Other specified noninflammatory disorders of vagina: Secondary | ICD-10-CM

## 2018-07-07 DIAGNOSIS — A599 Trichomoniasis, unspecified: Secondary | ICD-10-CM

## 2018-07-07 LAB — POCT WET PREP (WET MOUNT): Clue Cells Wet Prep Whiff POC: POSITIVE

## 2018-07-07 MED ORDER — METRONIDAZOLE 500 MG PO TABS: 500.0000 mg | ORAL_TABLET | Freq: Two times a day (BID) | ORAL | 0 refills | Status: DC

## 2018-07-07 NOTE — Patient Instructions (Signed)

## 2018-07-07 NOTE — Progress Notes (Signed)
   GYN VISIT Patient name: Judy Thompson MRN [id number]  Date of birth: 1976-05-30 Chief Complaint:   Vaginal Discharge (white in color; no itching, no odor)  History of Present Illness:   Judy Thompson is a 42 y.o. G31P1011 Caucasian female being seen today for report of white discharge x 1 month, no itching/odor.     Patient's last menstrual period was 06/18/2018 (approximate). The current method of family planning is tubal ligation. Last pap 2016. Results were:  normal Review of Systems:   Pertinent items are noted in HPI Denies fever/chills, dizziness, headaches, visual disturbances, fatigue, shortness of breath, chest pain, abdominal pain, vomiting, abnormal vaginal discharge/itching/odor/irritation, problems with periods, bowel movements, urination, or intercourse unless otherwise stated above.  Pertinent History Reviewed:  Reviewed past medical,surgical, social, obstetrical and family history.  Reviewed problem list, medications and allergies. Physical Assessment:   Vitals:   07/07/18 1151  BP: 107/71  Pulse: 74  Weight: 156 lb (70.8 kg)  Height: 5\' 7"  (1.702 m)  Body mass index is 24.43 kg/m.       Physical Examination:   General appearance: alert, well appearing, and in no distress  Mental status: alert, oriented to person, place, and time  Skin: warm & dry   Cardiovascular: normal heart rate noted  Respiratory: normal respiratory effort, no distress  Abdomen: soft, non-tender   Pelvic: VULVA: normal appearing vulva with no masses, tenderness or lesions, VAGINA: normal appearing vagina with normal color and small amt white malodorous discharge, no lesions, CERVIX: normal appearing cervix without discharge or lesions  Extremities: no edema   Results for orders placed or performed in visit on 07/07/18 (from the past 24 hour(s))  POCT Wet Prep [facility])   Collection Time: 07/07/18 12:52 PM  Result Value Ref Range   Source Wet Prep POC vaginal    WBC, Wet Prep  HPF POC many    Bacteria Wet Prep HPF POC Few Few   BACTERIA WET PREP MORPHOLOGY POC     Clue Cells Wet Prep HPF POC None None   Clue Cells Wet Prep Whiff POC Positive Whiff    Yeast Wet Prep HPF POC None None   KOH Wet Prep POC     Trichomonas Wet Prep HPF POC Present (A) Absent    Assessment & Plan:  1) Trichomonas> rx metronidazole 500mg  BID x 7d, no sex/etoh while taking, no sex x at least 7d from both being treated. Call me back w/ partners allergies (?if allergic to flagyl). POC in 3-4wks for pt. Send gc/ct  Meds:  Meds ordered this encounter  Medications  . metroNIDAZOLE (FLAGYL) 500 MG tablet    Sig: Take 1 tablet (500 mg total) by mouth 2 (two) times daily.    Dispense:  14 tablet    Refill:  0    Order Specific Question:   Supervising Provider    Answer:   Tania Ade H [[id number]]    Orders Placed This Encounter  Procedures  . GC/Chlamydia Probe Amp  . POCT Wet Prep [facility])    Return in about 1 month (around 08/07/2018) for Pap & physical.  Roma Schanz CNM, [facility] 07/07/2018 12:55 PM

## 2018-07-07 NOTE — Progress Notes (Signed)
Metronidazole 500mg  BID x 7d w/ 0RF called into [facility] for Byanca Kasper, DOB [birth year], All:PCN, no sex/etoh while taking Roma Schanz, CNM, [facility] 07/07/2018 2:07 PM

## 2018-08-07 ENCOUNTER — Encounter: Payer: Self-pay | Admitting: Women's Health

## 2018-08-07 ENCOUNTER — Other Ambulatory Visit (HOSPITAL_COMMUNITY)
Admission: RE | Admit: 2018-08-07 | Discharge: 2018-08-07 | Disposition: A | Discharge status: Home / Self Care | Payer: BLUE CROSS/BLUE SHIELD | Source: Ambulatory Visit | Attending: Obstetrics & Gynecology | Admitting: Obstetrics & Gynecology

## 2018-08-07 ENCOUNTER — Ambulatory Visit (INDEPENDENT_AMBULATORY_CARE_PROVIDER_SITE_OTHER): Payer: BLUE CROSS/BLUE SHIELD | Admitting: Women's Health

## 2018-08-07 VITALS — BP 96/62 | HR 87 | Ht >= 80 in | Wt 152.0 lb

## 2018-08-07 DIAGNOSIS — Z01419 Encounter for gynecological examination (general) (routine) without abnormal findings: Secondary | ICD-10-CM

## 2018-08-07 DIAGNOSIS — Z803 Family history of malignant neoplasm of breast: Secondary | ICD-10-CM | POA: Insufficient documentation

## 2018-08-07 DIAGNOSIS — Z09 Encounter for follow-up examination after completed treatment for conditions other than malignant neoplasm: Secondary | ICD-10-CM | POA: Diagnosis not present

## 2018-08-07 DIAGNOSIS — Z800 Family history of malignant neoplasm of digestive organs: Secondary | ICD-10-CM

## 2018-08-07 DIAGNOSIS — Z8619 Personal history of other infectious and parasitic diseases: Secondary | ICD-10-CM | POA: Diagnosis not present

## 2018-08-07 DIAGNOSIS — Z113 Encounter for screening for infections with a predominantly sexual mode of transmission: Secondary | ICD-10-CM | POA: Diagnosis not present

## 2018-08-07 DIAGNOSIS — A599 Trichomoniasis, unspecified: Secondary | ICD-10-CM

## 2018-08-07 LAB — POCT WET PREP (WET MOUNT)
Clue Cells Wet Prep Whiff POC: NEGATIVE
Trichomonas Wet Prep HPF POC: ABSENT

## 2018-08-07 NOTE — Patient Instructions (Addendum)
Screening mammogram at [facility] 12/5 @ 1pm, be there at 12:45pm. No lotion/powder/deoderant/perfume that day

## 2018-08-07 NOTE — Progress Notes (Signed)
WELL-WOMAN EXAMINATION Patient name: Judy Thompson MRN [id number]  Date of birth: 11/07/1975 Chief Complaint:   Gynecologic Exam (pap/physcial)  History of Present Illness:   Judy Thompson is a 42 y.o. G22P1011 Caucasian female being seen today for a routine well-woman exam. Recently tx for trichomonas, feels sx completely resolved.  Current complaints: none  PCP: Gerarda Fraction      does desire labs Patient's last menstrual period was 07/23/2018. The current method of family planning is tubal ligation Last pap 2016. Results were: normal Last mammogram: Aug 05, 2017. Results were: discrete asymmetric focal thickening in 12 o'clock retroareolar area Rt breast, biopsy revealed benign tissue w/ dense stromal fibrosis. Family h/o breast cancer: Yes, mom dx @ 30yo Last colonoscopy: never. Results were: n/a. Family h/o colorectal cancer: Yes, mom dx in 58s-50s Review of Systems:   Pertinent items are noted in HPI Denies any headaches, blurred vision, fatigue, shortness of breath, chest pain, abdominal pain, abnormal vaginal discharge/itching/odor/irritation, problems with periods, bowel movements, urination, or intercourse unless otherwise stated above. Pertinent History Reviewed:  Reviewed past medical,surgical, social and family history.  Reviewed problem list, medications and allergies. Physical Assessment:   Vitals:   08/07/18 1445  BP: 96/62  Pulse: 87  Weight: 152 lb (68.9 kg)  Height: 7' (2.134 m)  Body mass index is 15.15 kg/m.        Physical Examination:   General appearance - well appearing, and in no distress  Mental status - alert, oriented to person, place, and time  Psych:  She has a normal mood and affect  Skin - warm and dry, normal color, no suspicious lesions noted  Chest - effort normal, all lung fields clear to auscultation bilaterally  Heart - normal rate and regular rhythm  Neck:  midline trachea, no thyromegaly or nodules  Breasts - breasts appear normal,  no suspicious masses, no skin or nipple changes or  axillary nodes  Abdomen - soft, nontender, nondistended, no masses or organomegaly  Pelvic - VULVA: normal appearing vulva with no masses, tenderness or lesions  VAGINA: normal appearing vagina with normal color and discharge, no lesions  CERVIX: normal appearing cervix without discharge or lesions, no CMT  Thin prep pap is done w/ HR HPV cotesting  UTERUS: uterus is felt to be normal size, shape, consistency and nontender   ADNEXA: No adnexal masses or tenderness noted.  Extremities:  No swelling or varicosities noted  Results for orders placed or performed in visit on 08/07/18 (from the past 24 hour(s))  POCT Wet Prep [facility] [place])   Collection Time: 08/07/18  3:24 PM  Result Value Ref Range   Source Wet Prep POC vaginal    WBC, Wet Prep HPF POC none    Bacteria Wet Prep HPF POC Few Few   BACTERIA WET PREP MORPHOLOGY POC     Clue Cells Wet Prep HPF POC None None   Clue Cells Wet Prep Whiff POC Negative Whiff    Yeast Wet Prep HPF POC None None   KOH Wet Prep POC     Trichomonas Wet Prep HPF POC Absent Absent    Assessment & Plan:  1) Well-Woman Exam  2) Family h/o breast & colon cancer in mom> yearly screening mammograms, pt to call me back and let me know age mom dx w/ colon cancer so we can discuss recommendations for pt  3) Trichomonas POC neg  Labs/procedures today: pap, labs as listed below  Mammogram, scheduled for 12/5 @ 1300  Colonoscopy- waiting for call back from pt to determine  Orders Placed This Encounter  Procedures  . MM 3D SCREEN BREAST BILATERAL  . CBC  . Comprehensive metabolic panel  . TSH  . Hemoglobin A1c  . HIV Antibody (routine testing w rflx)  . RPR  . Hepatitis B surface antigen  . POCT Wet Prep [facility])    Follow-up: Return in about 1 year (around 08/08/2019) for Physical.  [place], [facility] 08/07/2018 3:29 PM

## 2018-08-08 LAB — COMPREHENSIVE METABOLIC PANEL
ALT: 19 IU/L (ref 0–32)
AST: 14 IU/L (ref 0–40)
Albumin/Globulin Ratio: 1.7 (ref 1.2–2.2)
Albumin: 4.4 g/dL (ref 3.5–5.5)
Alkaline Phosphatase: 99 IU/L (ref 39–117)
BUN/Creatinine Ratio: 11 (ref 9–23)
BUN: 8 mg/dL (ref 6–20)
Bilirubin Total: 0.2 mg/dL (ref 0.0–1.2)
CO2: 23 mmol/L (ref 20–29)
Calcium: 9.2 mg/dL (ref 8.7–10.2)
Chloride: 103 mmol/L (ref 96–106)
Creatinine, Ser: 0.71 mg/dL (ref 0.57–1.00)
GFR calc Af Amer: 124 mL/min/{1.73_m2} (ref >59)
GFR calc non Af Amer: 108 mL/min/{1.73_m2} (ref >59)
Globulin, Total: 2.6 g/dL (ref 1.5–4.5)
Glucose: 80 mg/dL (ref 65–99)
Potassium: 4 mmol/L (ref 3.5–5.2)
Sodium: 141 mmol/L (ref 134–144)
Total Protein: 7 g/dL (ref 6.0–8.5)

## 2018-08-08 LAB — CBC
Hematocrit: 38.9 % (ref 34.0–46.6)
Hemoglobin: 13.2 g/dL (ref 11.1–15.9)
MCH: 31.2 pg (ref 26.6–33.0)
MCHC: 33.9 g/dL (ref 31.5–35.7)
MCV: 92 fL (ref 79–97)
Platelets: 212 10*3/uL (ref 150–450)
RBC: 4.23 x10E6/uL (ref 3.77–5.28)
RDW: 12.2 % — ABNORMAL LOW (ref 12.3–15.4)
WBC: 7.5 10*3/uL (ref 3.4–10.8)

## 2018-08-08 LAB — HEPATITIS B SURFACE ANTIGEN: Hepatitis B Surface Ag: NEGATIVE

## 2018-08-08 LAB — RPR: RPR Ser Ql: NONREACTIVE

## 2018-08-08 LAB — HEMOGLOBIN A1C
Est. average glucose Bld gHb Est-mCnc: 91 mg/dL
Hgb A1c MFr Bld: 4.8 % (ref 4.8–5.6)

## 2018-08-08 LAB — HIV ANTIBODY (ROUTINE TESTING W REFLEX): HIV Screen 4th Generation wRfx: NONREACTIVE

## 2018-08-08 LAB — TSH: TSH: 1.07 u[IU]/mL (ref 0.450–4.500)

## 2018-08-12 LAB — CYTOLOGY - PAP
Chlamydia: NEGATIVE
Diagnosis: NEGATIVE
HPV: NOT DETECTED
Neisseria Gonorrhea: NEGATIVE

## 2018-08-20 ENCOUNTER — Telehealth: Payer: Self-pay | Admitting: *Deleted

## 2018-08-20 NOTE — Telephone Encounter (Signed)
Pt called to state that her mother was 65 when diagnosed with colon cancer, as Maudie Mercury had requested for her to do.

## 2018-08-28 ENCOUNTER — Ambulatory Visit (HOSPITAL_COMMUNITY): Payer: BLUE CROSS/BLUE SHIELD

## 2018-08-29 ENCOUNTER — Ambulatory Visit (HOSPITAL_COMMUNITY): Payer: BLUE CROSS/BLUE SHIELD

## 2018-09-01 ENCOUNTER — Ambulatory Visit (HOSPITAL_COMMUNITY)
Admission: RE | Admit: 2018-09-01 | Discharge: 2018-09-01 | Disposition: A | Discharge status: Home / Self Care | Payer: BLUE CROSS/BLUE SHIELD | Source: Ambulatory Visit | Attending: Women's Health | Admitting: Women's Health

## 2018-09-01 ENCOUNTER — Encounter (HOSPITAL_COMMUNITY): Payer: Self-pay

## 2018-09-01 DIAGNOSIS — Z1231 Encounter for screening mammogram for malignant neoplasm of breast: Secondary | ICD-10-CM | POA: Diagnosis not present

## 2018-09-01 DIAGNOSIS — Z803 Family history of malignant neoplasm of breast: Secondary | ICD-10-CM | POA: Diagnosis not present

## 2018-12-05 DIAGNOSIS — Z1389 Encounter for screening for other disorder: Secondary | ICD-10-CM | POA: Diagnosis not present

## 2018-12-05 DIAGNOSIS — F419 Anxiety disorder, unspecified: Secondary | ICD-10-CM | POA: Diagnosis not present

## 2018-12-05 DIAGNOSIS — Z6823 Body mass index (BMI) 23.0-23.9, adult: Secondary | ICD-10-CM | POA: Diagnosis not present

## 2019-01-01 DIAGNOSIS — Z6823 Body mass index (BMI) 23.0-23.9, adult: Secondary | ICD-10-CM | POA: Diagnosis not present

## 2019-01-01 DIAGNOSIS — F419 Anxiety disorder, unspecified: Secondary | ICD-10-CM | POA: Diagnosis not present

## 2019-01-01 DIAGNOSIS — Z1389 Encounter for screening for other disorder: Secondary | ICD-10-CM | POA: Diagnosis not present

## 2019-01-21 DIAGNOSIS — J329 Chronic sinusitis, unspecified: Secondary | ICD-10-CM | POA: Diagnosis not present

## 2019-01-21 DIAGNOSIS — J309 Allergic rhinitis, unspecified: Secondary | ICD-10-CM | POA: Diagnosis not present

## 2019-03-31 DIAGNOSIS — F419 Anxiety disorder, unspecified: Secondary | ICD-10-CM | POA: Diagnosis not present

## 2019-04-17 ENCOUNTER — Other Ambulatory Visit (HOSPITAL_COMMUNITY): Payer: Self-pay | Admitting: Internal Medicine

## 2019-04-17 DIAGNOSIS — M549 Dorsalgia, unspecified: Secondary | ICD-10-CM

## 2019-04-17 DIAGNOSIS — J029 Acute pharyngitis, unspecified: Secondary | ICD-10-CM | POA: Diagnosis not present

## 2019-04-17 DIAGNOSIS — M545 Low back pain: Secondary | ICD-10-CM | POA: Diagnosis not present

## 2019-04-17 DIAGNOSIS — Z6823 Body mass index (BMI) 23.0-23.9, adult: Secondary | ICD-10-CM | POA: Diagnosis not present

## 2019-04-17 DIAGNOSIS — H6692 Otitis media, unspecified, left ear: Secondary | ICD-10-CM | POA: Diagnosis not present

## 2019-04-17 DIAGNOSIS — Z1389 Encounter for screening for other disorder: Secondary | ICD-10-CM | POA: Diagnosis not present

## 2019-04-17 DIAGNOSIS — M5410 Radiculopathy, site unspecified: Secondary | ICD-10-CM | POA: Diagnosis not present

## 2019-04-20 ENCOUNTER — Other Ambulatory Visit: Payer: Self-pay

## 2019-04-20 ENCOUNTER — Ambulatory Visit (HOSPITAL_COMMUNITY)
Admission: RE | Admit: 2019-04-20 | Discharge: 2019-04-20 | Disposition: A | Discharge status: Home / Self Care | Payer: BC Managed Care – PPO | Source: Ambulatory Visit | Attending: Internal Medicine | Admitting: Internal Medicine

## 2019-04-20 DIAGNOSIS — M549 Dorsalgia, unspecified: Secondary | ICD-10-CM | POA: Diagnosis not present

## 2019-04-20 DIAGNOSIS — M545 Low back pain: Secondary | ICD-10-CM | POA: Diagnosis not present

## 2019-05-04 DIAGNOSIS — M5417 Radiculopathy, lumbosacral region: Secondary | ICD-10-CM | POA: Diagnosis not present

## 2019-05-04 DIAGNOSIS — M549 Dorsalgia, unspecified: Secondary | ICD-10-CM | POA: Diagnosis not present

## 2019-05-26 DIAGNOSIS — M5417 Radiculopathy, lumbosacral region: Secondary | ICD-10-CM | POA: Diagnosis not present

## 2019-05-28 ENCOUNTER — Other Ambulatory Visit: Payer: Self-pay | Admitting: *Deleted

## 2019-05-28 DIAGNOSIS — Z20822 Contact with and (suspected) exposure to covid-19: Secondary | ICD-10-CM

## 2019-05-28 DIAGNOSIS — R6889 Other general symptoms and signs: Secondary | ICD-10-CM | POA: Diagnosis not present

## 2019-05-29 LAB — NOVEL CORONAVIRUS, NAA: SARS-CoV-2, NAA: NOT DETECTED

## 2019-06-29 DIAGNOSIS — G894 Chronic pain syndrome: Secondary | ICD-10-CM | POA: Diagnosis not present

## 2019-07-30 DIAGNOSIS — G894 Chronic pain syndrome: Secondary | ICD-10-CM | POA: Diagnosis not present

## 2019-07-30 DIAGNOSIS — K219 Gastro-esophageal reflux disease without esophagitis: Secondary | ICD-10-CM | POA: Diagnosis not present

## 2019-08-19 ENCOUNTER — Encounter (HOSPITAL_COMMUNITY): Payer: Self-pay | Admitting: Emergency Medicine

## 2019-08-19 ENCOUNTER — Emergency Department (HOSPITAL_COMMUNITY): Payer: BC Managed Care – PPO

## 2019-08-19 ENCOUNTER — Emergency Department (HOSPITAL_COMMUNITY)
Admission: EM | Admit: 2019-08-19 | Discharge: 2019-08-19 | Disposition: A | Discharge status: Home / Self Care | Payer: BC Managed Care – PPO | Attending: Emergency Medicine | Admitting: Emergency Medicine

## 2019-08-19 ENCOUNTER — Other Ambulatory Visit: Payer: Self-pay

## 2019-08-19 DIAGNOSIS — M545 Low back pain, unspecified: Secondary | ICD-10-CM

## 2019-08-19 DIAGNOSIS — M533 Sacrococcygeal disorders, not elsewhere classified: Secondary | ICD-10-CM | POA: Diagnosis not present

## 2019-08-19 DIAGNOSIS — S3992XA Unspecified injury of lower back, initial encounter: Secondary | ICD-10-CM | POA: Diagnosis not present

## 2019-08-19 DIAGNOSIS — W010XXA Fall on same level from slipping, tripping and stumbling without subsequent striking against object, initial encounter: Secondary | ICD-10-CM | POA: Insufficient documentation

## 2019-08-19 DIAGNOSIS — Z79899 Other long term (current) drug therapy: Secondary | ICD-10-CM | POA: Insufficient documentation

## 2019-08-19 DIAGNOSIS — W19XXXA Unspecified fall, initial encounter: Secondary | ICD-10-CM

## 2019-08-19 MED ORDER — NAPROXEN 250 MG PO TABS
500.0000 mg | ORAL_TABLET | Freq: Once | ORAL | Status: AC
  Administered 2019-08-19: 500 mg via ORAL
  Filled 2019-08-19: qty 2

## 2019-08-19 MED ORDER — LIDOCAINE 5 % EX PTCH: 1.0000 | MEDICATED_PATCH | CUTANEOUS | 0 refills | Status: DC

## 2019-08-19 MED ORDER — NAPROXEN 500 MG PO TABS: 500.0000 mg | ORAL_TABLET | Freq: Two times a day (BID) | ORAL | 0 refills | Status: DC

## 2019-08-19 MED ORDER — OXYCODONE-ACETAMINOPHEN 5-325 MG PO TABS
1.0000 | ORAL_TABLET | Freq: Once | ORAL | Status: DC
  Filled 2019-08-19: qty 1

## 2019-08-19 NOTE — ED Provider Notes (Signed)
**Note Judy-Identified via Obfuscation** [facility] EMERGENCY DEPARTMENT Provider Note   CSN: [id number] Arrival date & time: 08/19/19  1154     History   Chief Complaint Chief Complaint  Patient presents with  . Fall    HPI Judy Thompson is a 43 y.o. female with a hx of prior tubal ligation who presents to the ED s/p fall last night with complaints of lower back pain. Patient states she was walking out the door when she slipped and fell onto her lower back. Denies head injury or LOC. Was able to slowly get up. Having pain to the lower back, constant, severe, worse with movement, no alleviating factors, no intervention PTA. She takes PRN oxycodone for chronic R hip pain but has not taken this since the fall. Denies headache, neck pain, chest pain, abdominal pain, numbness, tingling, weakness, saddle anesthesia, incontinence to bowel/bladder, fever, chills, IV drug use, dysuria, or hx of cancer. Patient has not had prior back surgeries. Denies chance of pregnancy.       HPI  Past Medical History:  Diagnosis Date  . Medical history non-contributory     Patient Active Problem List   Diagnosis Date Noted  . Family history of breast cancer in mother 08/07/2018  . Family history of colon cancer in mother 08/07/2018  . Trichomonas infection 07/07/2018    Past Surgical History:  Procedure Laterality Date  . BREAST BIOPSY Right 2018   Benign  . NO PAST SURGERIES    . TUBAL LIGATION Bilateral 09/08/2016   Procedure: POST PARTUM TUBAL LIGATION;  Surgeon: Woodroe Mode, MD;  Location: [facility];  Service: Gynecology;  Laterality: Bilateral;     OB History    Gravida  2   Para  1   Term  1   Preterm  0   AB  1   Living  1     SAB  1   TAB  0   Ectopic  0   Multiple  0   Live Births  1            Home Medications    Prior to Admission medications   Medication Sig Start Date End Date Taking? Authorizing Provider  ibuprofen (ADVIL,MOTRIN) 600 MG tablet TAKE 1 TABLET (600 MG TOTAL) BY MOUTH  EVERY 6 (SIX) HOURS. 10/15/16   Shelly Bombard, MD  levocetirizine (XYZAL) 5 MG tablet Take 5 mg by mouth daily. 05/22/16   [provider]  metroNIDAZOLE (FLAGYL) 500 MG tablet Take 1 tablet (500 mg total) by mouth 2 (two) times daily. Patient not taking: Reported on 08/07/2018 07/07/18   Roma Schanz, CNM  Prenatal Vit-Fe Fumarate-FA (PRENATAL VITAMIN PO) Take by mouth daily.    [provider]    Family History Family History  Problem Relation Age of Onset  . Hypertension Mother   . Cancer Mother        breast, colon  . Heart disease Father   . Diabetes Paternal Grandmother   . Hypertension Paternal Grandmother     Social History Social History   Tobacco Use  . Smoking status: Never Smoker  . Smokeless tobacco: Never Used  Substance Use Topics  . Alcohol use: No  . Drug use: No     Allergies   Patient has no known allergies.   Review of Systems Review of Systems  Constitutional: Negative for chills and fever.  Respiratory: Negative for shortness of breath.   Cardiovascular: Negative for chest pain.  Gastrointestinal: Negative for abdominal  pain and vomiting.  Musculoskeletal: Positive for back pain. Negative for neck pain.  Neurological: Negative for weakness, numbness and headaches.       Negative for saddle anesthesia or incontinence.     Physical Exam Updated Vital Signs BP 124/83 (BP Location: Right Arm)   Pulse 94   Temp 98.5 F (36.9 C) (Oral)   Resp 17   Ht 5\' 7"  (1.702 m)   Wt 72.6 kg   SpO2 100%   BMI 25.06 kg/m   Physical Exam Vitals signs and nursing note reviewed.  Constitutional:      General: She is not in acute distress.    Appearance: She is well-developed. She is not toxic-appearing.  HENT:     Head: Normocephalic and atraumatic.     Comments: No racoon eyes/battle sign.  Eyes:     General:        Right eye: No discharge.        Left eye: No discharge.     Conjunctiva/sclera: Conjunctivae normal.   Neck:     Musculoskeletal: Normal range of motion and neck supple. No spinous process tenderness or muscular tenderness.     Comments: No midline tenderness.  Cardiovascular:     Rate and Rhythm: Normal rate and regular rhythm.  Pulmonary:     Effort: Pulmonary effort is normal. No respiratory distress.     Breath sounds: Normal breath sounds. No wheezing, rhonchi or rales.  Chest:     Chest wall: No tenderness.  Abdominal:     General: There is no distension.     Palpations: Abdomen is soft.     Tenderness: There is no abdominal tenderness. There is no guarding or rebound.  Musculoskeletal:     Comments: No obvious deformity, appreciable swelling, erythema, ecchymosis, significant open wounds, or increased warmth.  Extremities: Normal ROM. Nontender.  Back: Patient diffusely tender throughout lumbar, sacral, & coccygeal midline region. No point/focal vertebral tenderness, no palpable step off or crepitus.   Skin:    General: Skin is warm and dry.     Findings: No rash.  Neurological:     General: No focal deficit present.     Mental Status: She is alert.     Deep Tendon Reflexes:     Reflex Scores:      Patellar reflexes are 2+ on the right side and 2+ on the left side.    Comments: Sensation grossly intact to bilateral lower extremities. 5/5 symmetric strength with plantar/dorsiflexion bilaterally. Gait is intact without obvious foot drop.   Psychiatric:        Behavior: Behavior normal.    ED Treatments / Results  Labs (all labs ordered are listed, but only abnormal results are displayed) Labs Reviewed - No data to display  EKG None  Radiology Dg Lumbar Spine Complete  Result Date: 08/19/2019 CLINICAL DATA:  Pain following fall EXAM: LUMBAR SPINE - COMPLETE 4+ VIEW COMPARISON:  April 20, 2019 FINDINGS: Frontal, lateral, spot lumbosacral lateral, and bilateral oblique views were obtained. There are 5 non-rib-bearing lumbar type vertebral bodies. There is slight lumbar  levoscoliosis. There is no fracture or spondylolisthesis. The disc spaces appear unremarkable. There is no appreciable facet arthropathy. IMPRESSION: Slight scoliosis. No fracture or spondylolisthesis. No appreciable arthropathy in the lumbar region. Electronically Signed   By: Lowella Grip III M.D.   On: 08/19/2019 13:10   Dg Sacrum/coccyx  Result Date: 08/19/2019 CLINICAL DATA:  Pain following fall EXAM: SACRUM AND COCCYX - 2+ VIEW COMPARISON:  None. FINDINGS: Frontal, angled frontal, and lateral views obtained. No fracture or diastasis evident. There is mild osteoarthritic change in the sacroiliac joints. No erosive change. Tubal ligation clips noted in the pelvis. IMPRESSION: No fracture or diastasis. Mild osteoarthritic change in the sacroiliac joints. No sacroiliitis evident. Tubal ligation clips present. Electronically Signed   By: Lowella Grip III M.D.   On: 08/19/2019 13:09    Procedures Procedures (including critical care time)  Medications Ordered in ED Medications  oxyCODONE-acetaminophen (PERCOCET/ROXICET) 5-325 MG per tablet 1 tablet (has no administration in time range)  naproxen (NAPROSYN) tablet 500 mg (500 mg Oral Given 08/19/19 1234)     Initial Impression / Assessment and Plan / ED Course  I have reviewed the triage vital signs and the nursing notes.  Pertinent labs & imaging results that were available during my care of the patient were reviewed by me and considered in my medical decision making (see chart for details).   Patient presents to the ED s/p mechanical fall last night with complaints of lower back pain. Non toxic appearing, vitals WNL. Chest/abdomen are nontender. No signs of serious head/neck/back injury. Do not feel CT head/neck are necessary per [place] CT trauma rules. T spine nontender. Lumbar/sacral/coccygeal xrays without acute traumatic injury. Neurologically intact. No back pain red flags. Patient has PRN oxycodone to take at home, will  provide Naproxen & Lidoderm to help with discomfort as well. I discussed results, treatment plan, need for follow-up, and return precautions with the patient. Provided opportunity for questions, patient confirmed understanding and is in agreement with plan.   Final Clinical Impressions(s) / ED Diagnoses   Final diagnoses:  Fall, initial encounter  Acute midline low back pain without sciatica    ED Discharge Orders         Ordered    naproxen (NAPROSYN) 500 MG tablet  2 times daily     08/19/19 1333    lidocaine (LIDODERM) 5 %  Every 24 hours     08/19/19 [address] [place], PA-C 08/19/19 1337    Veryl Speak, MD 08/19/19 1453

## 2019-08-19 NOTE — ED Notes (Signed)
Patient driving herself, unable to admin. Percocet.

## 2019-08-19 NOTE — ED Triage Notes (Signed)
Pt tripped and fell on her back last night.  Pain since.  difficulting walking

## 2019-08-19 NOTE — Discharge Instructions (Addendum)
You were seen in the emergency department today for low back pain after a fall.  Your x-ray did not show any fractures or dislocations.  We are sending home with the following medicines: - Naproxen is a nonsteroidal anti-inflammatory medication that will help with pain and swelling. Be sure to take this medication as prescribed with food, 1 pill every 12 hours,  It should be taken with food, as it can cause stomach upset, and more seriously, stomach bleeding. Do not take other nonsteroidal anti-inflammatory medications with this such as Advil, Motrin, Aleve, Mobic, Goodie Powder, or Motrin.    - Lidoderm patch-this is a topical patch to place directly over your lower back in the arrhythmia significant pain once per day to help numb/to the area.  Please continue to take your oxycodone as needed for pain at home.  We have prescribed you new medication(s) today. Discuss the medications prescribed today with your pharmacist as they can have adverse effects and interactions with your other medicines including over the counter and prescribed medications. Seek medical evaluation if you start to experience new or abnormal symptoms after taking one of these medicines, seek care immediately if you start to experience difficulty breathing, feeling of your throat closing, facial swelling, or rash as these could be indications of a more serious allergic reaction   Follow-up with your primary care provider within 3 days for reevaluation.  Return to the ER for new or worsening symptoms including but not limited to worsening pain, inability to walk, numbness in your legs, weakness in your legs, loss of control of bowel/bladder function, or any other concerns.

## 2019-08-25 DIAGNOSIS — G894 Chronic pain syndrome: Secondary | ICD-10-CM | POA: Diagnosis not present

## 2019-09-07 IMAGING — US US BREAST*R* LIMITED INC AXILLA
1 series · 3 of 3 positions shown · non-contrast
Comparison: 07/09/2012

CLINICAL DATA: Palpable abnormality in the retroareolar region of
the right breast, first noted 3 months ago. Mass is associated with
tenderness.

EXAM:
2D DIGITAL DIAGNOSTIC BILATERAL MAMMOGRAM WITH CAD AND ADJUNCT TOMO
ULTRASOUND RIGHT BREAST

[Series 1: us breast*right* limited inc axilla · 0.07mm/px · 3 of 3 slices shown]
[im 1/3]
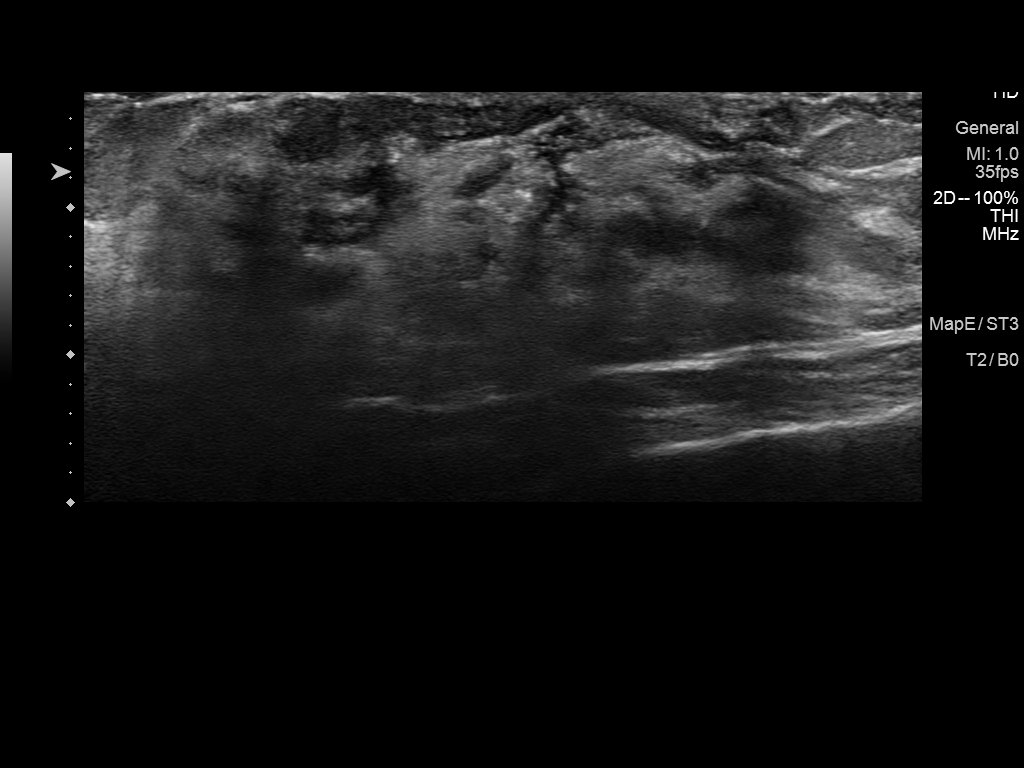
[im 2/3]
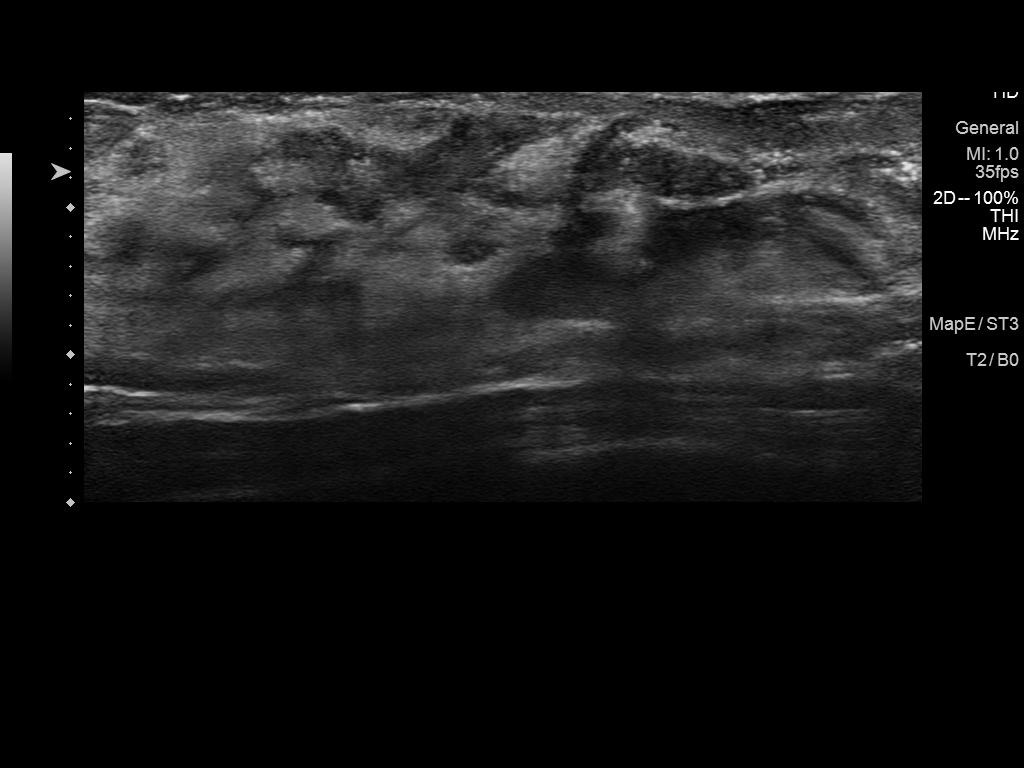
[im 3/3]
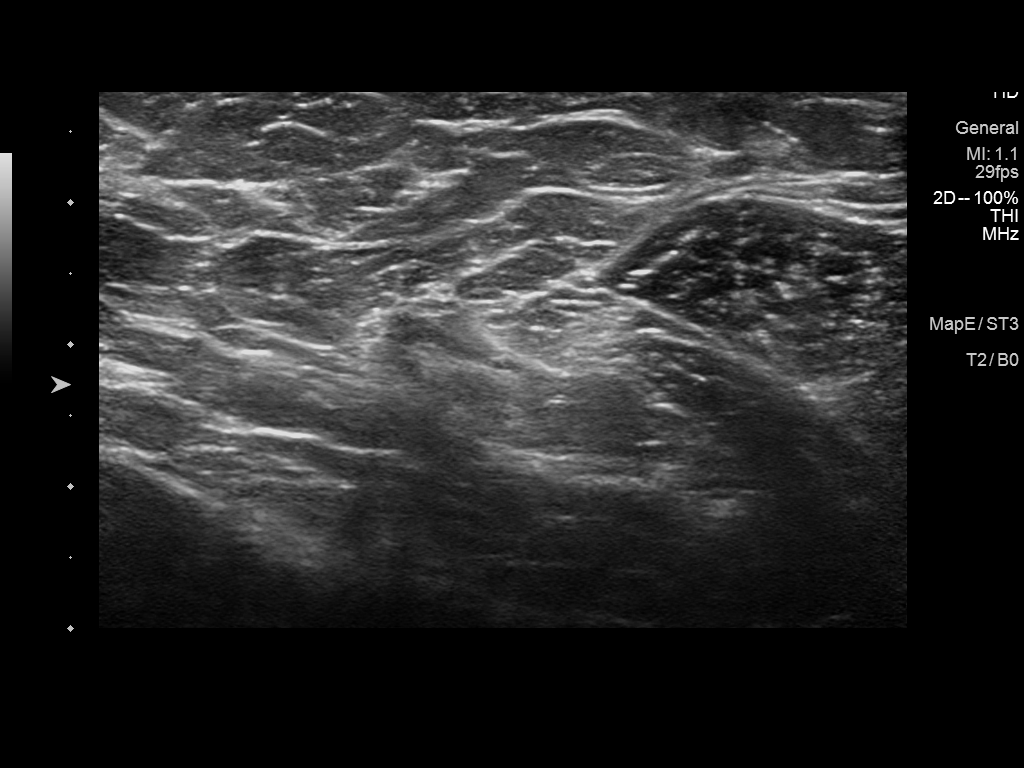

[3 of 3 positions shown; findings below may reference images not displayed]

ACR Breast Density Category c: The breast tissue is heterogeneously
dense, which may obscure small masses.
FINDINGS: No suspicious mass, distortion, or microcalcifications are
identified to suggest presence of malignancy. Spot tangential view
in the area of patient's concern is negative.

Mammographic images were processed with CAD.

On physical exam, I palpate discrete focal thickening in the 12
o'clock location of the right breast, 1 cm from the nipple. This is
asymmetric compared to the left breast.

Targeted ultrasound is performed, showing normal appearing extremely
dense fibroglandular tissue in the upper portion of the right
breast. No mass, distortion, or acoustic shadowing is demonstrated
with ultrasound. Evaluation of the right axilla is negative for
adenopathy.
IMPRESSION: Discrete, asymmetric focal thickening in the 12 o'clock location of
the right breast without imaging correlate. The patient states this
is a definitive change over the last 3 months and further evaluation
is indicated. We discussed the option of ultrasound-guided needle
biopsy of this region, and the patient is interested in pursuing
this biopsy.

RECOMMENDATION:
Ultrasound-guided core biopsy of focal thickening in the 12 o'clock
location of the right breast.

I have discussed the findings and recommendations with the patient.
Results were also provided in writing at the conclusion of the
visit. If applicable, a reminder letter will be sent to the patient
regarding the next appointment.

BI-RADS CATEGORY  4: Suspicious.

## 2019-09-08 ENCOUNTER — Ambulatory Visit: Payer: BC Managed Care – PPO | Attending: Internal Medicine

## 2019-09-08 ENCOUNTER — Other Ambulatory Visit: Payer: Self-pay

## 2019-09-08 DIAGNOSIS — Z20822 Contact with and (suspected) exposure to covid-19: Secondary | ICD-10-CM

## 2019-09-08 DIAGNOSIS — Z20828 Contact with and (suspected) exposure to other viral communicable diseases: Secondary | ICD-10-CM | POA: Diagnosis not present

## 2019-09-09 LAB — NOVEL CORONAVIRUS, NAA: SARS-CoV-2, NAA: NOT DETECTED

## 2019-10-01 DIAGNOSIS — G894 Chronic pain syndrome: Secondary | ICD-10-CM | POA: Diagnosis not present

## 2019-10-01 DIAGNOSIS — F419 Anxiety disorder, unspecified: Secondary | ICD-10-CM | POA: Diagnosis not present

## 2019-10-13 ENCOUNTER — Encounter: Payer: Self-pay | Admitting: Adult Health

## 2019-10-13 ENCOUNTER — Other Ambulatory Visit: Payer: Self-pay

## 2019-10-13 ENCOUNTER — Ambulatory Visit: Payer: BC Managed Care – PPO | Admitting: Adult Health

## 2019-10-13 VITALS — BP 101/66 | HR 76 | Ht 67.0 in | Wt 163.4 lb

## 2019-10-13 DIAGNOSIS — N926 Irregular menstruation, unspecified: Secondary | ICD-10-CM | POA: Insufficient documentation

## 2019-10-13 DIAGNOSIS — N760 Acute vaginitis: Secondary | ICD-10-CM | POA: Diagnosis not present

## 2019-10-13 DIAGNOSIS — B9689 Other specified bacterial agents as the cause of diseases classified elsewhere: Secondary | ICD-10-CM | POA: Insufficient documentation

## 2019-10-13 DIAGNOSIS — N898 Other specified noninflammatory disorders of vagina: Secondary | ICD-10-CM

## 2019-10-13 DIAGNOSIS — Z3202 Encounter for pregnancy test, result negative: Secondary | ICD-10-CM | POA: Diagnosis not present

## 2019-10-13 LAB — POCT WET PREP (WET MOUNT)
Clue Cells Wet Prep Whiff POC: POSITIVE
Trichomonas Wet Prep HPF POC: ABSENT
WBC, Wet Prep HPF POC: POSITIVE

## 2019-10-13 LAB — POCT URINE PREGNANCY: Preg Test, Ur: NEGATIVE

## 2019-10-13 MED ORDER — METRONIDAZOLE 0.75 % VA GEL: 1.0000 | Freq: Every day | VAGINAL | 0 refills | Status: DC

## 2019-10-13 NOTE — Patient Instructions (Signed)
Bacterial Vaginosis  Bacterial vaginosis is a vaginal infection that occurs when the normal balance of bacteria in the vagina is disrupted. It results from an overgrowth of certain bacteria. This is the most common vaginal infection among women ages 15-44. Because bacterial vaginosis increases your risk for STIs (sexually transmitted infections), getting treated can help reduce your risk for chlamydia, gonorrhea, herpes, and HIV (human immunodeficiency virus). Treatment is also important for preventing complications in pregnant women, because this condition can cause an early (premature) delivery. What are the causes? This condition is caused by an increase in harmful bacteria that are normally present in small amounts in the vagina. However, the reason that the condition develops is not fully understood. What increases the risk? The following factors may make you more likely to develop this condition:  Having a new sexual partner or multiple sexual partners.  Having unprotected sex.  Douching.  Having an intrauterine device (IUD).  Smoking.  Drug and alcohol abuse.  Taking certain antibiotic medicines.  Being pregnant. You cannot get bacterial vaginosis from toilet seats, bedding, swimming pools, or contact with objects around you. What are the signs or symptoms? Symptoms of this condition include:  Grey or white vaginal discharge. The discharge can also be watery or foamy.  A fish-like odor with discharge, especially after sexual intercourse or during menstruation.  Itching in and around the vagina.  Burning or pain with urination. Some women with bacterial vaginosis have no signs or symptoms. How is this diagnosed? This condition is diagnosed based on:  Your medical history.  A physical exam of the vagina.  Testing a sample of vaginal fluid under a microscope to look for a large amount of bad bacteria or abnormal cells. Your health care provider may use a cotton swab or  a small wooden spatula to collect the sample. How is this treated? This condition is treated with antibiotics. These may be given as a pill, a vaginal cream, or a medicine that is put into the vagina (suppository). If the condition comes back after treatment, a second round of antibiotics may be needed. Follow these instructions at home: Medicines  Take over-the-counter and prescription medicines only as told by your health care provider.  Take or use your antibiotic as told by your health care provider. Do not stop taking or using the antibiotic even if you start to feel better. General instructions  If you have a female sexual partner, tell her that you have a vaginal infection. She should see her health care provider and be treated if she has symptoms. If you have a female sexual partner, he does not need treatment.  During treatment: ? Avoid sexual activity until you finish treatment. ? Do not douche. ? Avoid alcohol as directed by your health care provider. ? Avoid breastfeeding as directed by your health care provider.  Drink enough water and fluids to keep your urine clear or pale yellow.  Keep the area around your vagina and rectum clean. ? Wash the area daily with warm water. ? Wipe yourself from front to back after using the toilet.  Keep all follow-up visits as told by your health care provider. This is important. How is this prevented?  Do not douche.  Wash the outside of your vagina with warm water only.  Use protection when having sex. This includes latex condoms and dental dams.  Limit how many sexual partners you have. To help prevent bacterial vaginosis, it is best to have sex with just one partner (  monogamous).  Make sure you and your sexual partner are tested for STIs.  Wear cotton or cotton-lined underwear.  Avoid wearing tight pants and pantyhose, especially during summer.  Limit the amount of alcohol that you drink.  Do not use any products that contain  nicotine or tobacco, such as cigarettes and e-cigarettes. If you need help quitting, ask your health care provider.  Do not use illegal drugs. Where to find more information  Centers for Disease Control and Prevention: www.cdc.gov/std  American Sexual Health Association (ASHA): www.ashastd.org  U.S. Department of Health and Human Services, Office on Women's Health: www.womenshealth.gov/ or https://www.womenshealth.gov/a-z-topics/bacterial-vaginosis Contact a health care provider if:  Your symptoms do not improve, even after treatment.  You have more discharge or pain when urinating.  You have a fever.  You have pain in your abdomen.  You have pain during sex.  You have vaginal bleeding between periods. Summary  Bacterial vaginosis is a vaginal infection that occurs when the normal balance of bacteria in the vagina is disrupted.  Because bacterial vaginosis increases your risk for STIs (sexually transmitted infections), getting treated can help reduce your risk for chlamydia, gonorrhea, herpes, and HIV (human immunodeficiency virus). Treatment is also important for preventing complications in pregnant women, because the condition can cause an early (premature) delivery.  This condition is treated with antibiotic medicines. These may be given as a pill, a vaginal cream, or a medicine that is put into the vagina (suppository). This information is not intended to replace advice given to you by your health care provider. Make sure you discuss any questions you have with your health care provider. Document Revised: 08/23/2017 Document Reviewed: 05/26/2016 Elsevier Patient Education  2020 Elsevier Inc.  

## 2019-10-13 NOTE — Progress Notes (Signed)
  Subjective:     Patient ID: Judy Thompson, female   DOB: 05/27/1976, 44 y.o.   MRN: [id number]  HPI Judy Thompson is a 44 year old white female, married, G2P101, in complaining of spotting 1-2 days a week for last 2 months, no pain.She is had a tubal ligation.She had normal pap with negative HPV 08/07/18. PCP is Dr Judy Thompson.  Review of Systems Has spotting for 1-2 days every week for last 2 months Denies any pain with sex, or new partners Denies any pain, or odor, no clots  Reviewed past medical,surgical, social and family history. Reviewed medications and allergies.     Objective:   Physical Exam BP 101/66 (BP Location: Left Arm, Patient Position: Sitting, Cuff Size: Normal)   Pulse 76   Ht 5\' 7"  (1.702 m)   Wt 163 lb 6.4 oz (74.1 kg)   LMP 09/22/2019 (Exact Date)   BMI 25.59 kg/m UPT is negative. Fall risk is low.PHQ 2 score is 0. Skin warm and dry.Pelvic: external genitalia is normal in appearance no lesions, vagina: white discharge with odor,urethra has no lesions or masses noted, cervix:bulbous,has nabothian cyst at 5-6 o'clock, no CMT, uterus: normal size, shape and contour, non tender, no masses felt, adnexa: no masses or tenderness noted. Bladder is non tender and no masses felt. Wet prep: + for clue cells and +WBCs. Examination chaperoned by Judy Infante LPN    Assessment:     1. Urine pregnancy test negative  2. Irregular bleeding  3. Vaginal discharge  4. Vaginal odor Rx Metrogel  5. BV (bacterial vaginosis) Will Rx Metrogel  Meds ordered this encounter  Medications  . metroNIDAZOLE (METROGEL VAGINAL) 0.75 % vaginal gel    Sig: Place 1 Applicatorful vaginally at bedtime.    Dispense:  70 g    Refill:  0    Order Specific Question:   Supervising Provider    Answer:   Judy Thompson [[id number]]  No sex during treatment  Review handout on BV      Plan:     Return in about 2 weeks for physical and if still having irregular bleeding will get [place]

## 2019-10-28 DIAGNOSIS — G894 Chronic pain syndrome: Secondary | ICD-10-CM | POA: Diagnosis not present

## 2019-10-29 ENCOUNTER — Other Ambulatory Visit: Payer: BC Managed Care – PPO | Admitting: Adult Health

## 2019-12-02 DIAGNOSIS — G894 Chronic pain syndrome: Secondary | ICD-10-CM | POA: Diagnosis not present

## 2019-12-16 ENCOUNTER — Encounter: Payer: Self-pay | Admitting: Adult Health

## 2019-12-16 ENCOUNTER — Encounter: Payer: Self-pay | Admitting: Internal Medicine

## 2019-12-16 ENCOUNTER — Ambulatory Visit (INDEPENDENT_AMBULATORY_CARE_PROVIDER_SITE_OTHER): Payer: BC Managed Care – PPO | Admitting: Adult Health

## 2019-12-16 ENCOUNTER — Other Ambulatory Visit: Payer: Self-pay

## 2019-12-16 VITALS — BP 108/69 | HR 85 | Ht 67.0 in | Wt 161.4 lb

## 2019-12-16 DIAGNOSIS — Z800 Family history of malignant neoplasm of digestive organs: Secondary | ICD-10-CM | POA: Diagnosis not present

## 2019-12-16 DIAGNOSIS — Z01419 Encounter for gynecological examination (general) (routine) without abnormal findings: Secondary | ICD-10-CM

## 2019-12-16 DIAGNOSIS — Z1211 Encounter for screening for malignant neoplasm of colon: Secondary | ICD-10-CM

## 2019-12-16 DIAGNOSIS — Z1212 Encounter for screening for malignant neoplasm of rectum: Secondary | ICD-10-CM | POA: Diagnosis not present

## 2019-12-16 DIAGNOSIS — N926 Irregular menstruation, unspecified: Secondary | ICD-10-CM

## 2019-12-16 LAB — HEMOCCULT GUIAC POC 1CARD (OFFICE): Fecal Occult Blood, POC: NEGATIVE

## 2019-12-16 NOTE — Progress Notes (Signed)
Patient ID: Judy Thompson, female   DOB: 29-Mar-1976, 44 y.o.   MRN: [id number] History of Present Illness: Judy Thompson is a 44 year old white female,married, G2P1011, in for a well woman gyn exam.She had normal pap with negative HPV, 08/07/18. PCP is Dr Gerarda Fraction.   Current Medications, Allergies, Past Medical History, Past Surgical History, Family History and Social History were reviewed in [facility] record.     Review of Systems: Patient denies any headaches, hearing loss, fatigue, blurred vision, shortness of breath, chest pain, abdominal pain, problems with bowel movements, urination, or intercourse(not active). No joint pain or mood swings. Has period, then spots the next week when wipes, no pain     Physical Exam:BP 108/69 (BP Location: Left Arm, Patient Position: Sitting, Cuff Size: Normal)   Pulse 85   Ht 5\' 7"  (1.702 m)   Wt 161 lb 6.4 oz (73.2 kg)   LMP 12/02/2019 (Exact Date)   BMI 25.28 kg/m  General:  Well developed, well nourished, no acute distress Skin:  Warm and dry Neck:  Midline trachea, normal thyroid, good ROM, no lymphadenopathy Lungs; Clear to auscultation bilaterally Breast:  No dominant palpable mass, retraction, or nipple discharge Cardiovascular: Regular rate and rhythm Abdomen:  Soft, non tender, no hepatosplenomegaly Pelvic:  External genitalia is normal in appearance, no lesions.  The vagina is normal in appearance. Urethra has no lesions or masses. The cervix is bulbous.  Uterus is felt to be normal size, shape, and contour.  No adnexal masses or tenderness noted.Bladder is non tender, no masses felt. Rectal: Good sphincter tone, no polyps, or hemorrhoids felt.  Hemoccult negative. Extremities/musculoskeletal:  No swelling or varicosities noted, no clubbing or cyanosis Psych:  No mood changes, alert and cooperative,seems happy Fall risk is low PHQ 2 score is 0. Examination chaperoned by Rolena Infante LPN   Impression and  Plan: 1. Encounter for well woman exam with routine gynecological exam Pap and physical in 1 year Get mammogram now and yearly  2. Screening for colorectal cancer Referred to Dr Gala Romney for colonoscopy   3. Irregular bleeding Return in about 2 weeks for GYN [place] to assess, she declines OCs  4. Family history of colon cancer in mother Referred to Dr Gala Romney for colonoscopy

## 2019-12-30 ENCOUNTER — Other Ambulatory Visit: Payer: Self-pay

## 2019-12-30 ENCOUNTER — Ambulatory Visit (INDEPENDENT_AMBULATORY_CARE_PROVIDER_SITE_OTHER): Payer: BC Managed Care – PPO

## 2019-12-30 DIAGNOSIS — N926 Irregular menstruation, unspecified: Secondary | ICD-10-CM

## 2019-12-30 DIAGNOSIS — D252 Subserosal leiomyoma of uterus: Secondary | ICD-10-CM

## 2019-12-30 NOTE — Progress Notes (Signed)
PELVIC US TA/TV:homogeneous retroverted uterus,1.6 x 1.7 x 1.2 cm subserosal fibroid lower uterine segment,EEC 13.2 mm,normal ovaries,ovaries appear mobile,no pain during ultrasound,small amount of simple cul de sac fluid

## 2020-01-04 ENCOUNTER — Telehealth: Payer: Self-pay | Admitting: Adult Health

## 2020-01-04 DIAGNOSIS — G894 Chronic pain syndrome: Secondary | ICD-10-CM | POA: Diagnosis not present

## 2020-01-04 DIAGNOSIS — F419 Anxiety disorder, unspecified: Secondary | ICD-10-CM | POA: Diagnosis not present

## 2020-01-04 MED ORDER — MEDROXYPROGESTERONE ACETATE 10 MG PO TABS: ORAL_TABLET | ORAL | 0 refills | Status: DC

## 2020-01-04 NOTE — Telephone Encounter (Signed)
Pt made aware of [place] and will try provera 10 mg for 14 days and then repeat US, call me with bleeding.

## 2020-01-20 ENCOUNTER — Other Ambulatory Visit: Payer: Self-pay

## 2020-01-20 ENCOUNTER — Ambulatory Visit: Payer: BC Managed Care – PPO | Admitting: Gastroenterology

## 2020-01-20 ENCOUNTER — Encounter: Payer: Self-pay | Admitting: Gastroenterology

## 2020-01-20 VITALS — BP 123/76 | HR 80 | Temp 97.7°F | Ht 66.0 in | Wt 159.6 lb

## 2020-01-20 DIAGNOSIS — Z800 Family history of malignant neoplasm of digestive organs: Secondary | ICD-10-CM | POA: Diagnosis not present

## 2020-01-20 NOTE — Assessment & Plan Note (Signed)
44 year old female with family history of colon cancer, mother at age 27.  Patient has never had a colonoscopy.  Recently stool was heme-negative.  Denies any bowel concerns.  Given chronic opioid use, will plan for deep sedation.  Colonoscopy in the near future. ASA I. I have discussed the risks, alternatives, benefits with regards to but not limited to the risk of reaction to medication, bleeding, infection, perforation and the patient is agreeable to proceed. Written consent to be obtained.  Patient has been provided with current guidelines for CRC screening regarding timing of first colonoscopy for her child.

## 2020-01-20 NOTE — Progress Notes (Signed)
CC'ED TO PCP 

## 2020-01-20 NOTE — Progress Notes (Signed)
Primary Care Physician:  Redmond School, MD Referring provider: Derrek Monaco, NP Primary Gastroenterologist:  Garfield Cornea, MD   Chief Complaint  Patient presents with  . Consult    TCS never done prior. Mother diagnosed colon cancer age 42-37.    HPI:  Judy Thompson is a 44 y.o. female here at the request of Derrek Monaco, NP for colonoscopy.  Patient's mother was diagnosed with colon cancer around age 74.  She had to have surgical resection but did not require any other treatment.  Patient's mother also had breast cancer.  She is not aware of any of her father's family history.  There is no other family history of colon cancer.  Patient has never had a colonoscopy.  Denies any bowel concerns.BM regular. No melena, brbpr.  No abdominal pain.  No heartburn on pantoprazole.  Has been on PPI for about a year, has taken it in the past for periods of time.  No dysphagia.  No unintentional weight loss.  Recently stool heme-negative on DRE by GYN.  Patient has chronic back issues.  She averages 1-2 oxycodone per day.  Current Outpatient Medications  Medication Sig Dispense Refill  . ALPRAZolam (XANAX) 0.5 MG tablet Take 0.5 mg by mouth 3 (three) times daily as needed.    Marland Kitchen ibuprofen (ADVIL,MOTRIN) 600 MG tablet TAKE 1 TABLET (600 MG TOTAL) BY MOUTH EVERY 6 (SIX) HOURS. (Patient taking differently: Take 600 mg by mouth every 6 (six) hours. As needed) 30 tablet 5  . levocetirizine (XYZAL) 5 MG tablet Take 5 mg by mouth daily.  11  . naproxen (NAPROSYN) 500 MG tablet Take 1 tablet (500 mg total) by mouth 2 (two) times daily. (Patient taking differently: Take 500 mg by mouth 2 (two) times daily. As needed) 10 tablet 0  . Oxycodone HCl 10 MG TABS Take 10-20 mg by mouth 2 (two) times daily as needed.     . pantoprazole (PROTONIX) 40 MG tablet Take 40 mg by mouth daily.     No current facility-administered medications for this visit.    Allergies as of 01/20/2020  . (No Known Allergies)     Past Medical History:  Diagnosis Date  . Allergies   . Anxiety   . Chronic back pain   . Medical history non-contributory     Past Surgical History:  Procedure Laterality Date  . BREAST BIOPSY Right 2018   Benign  . TUBAL LIGATION Bilateral 09/08/2016   Procedure: POST PARTUM TUBAL LIGATION;  Surgeon: Woodroe Mode, MD;  Location: [facility];  Service: Gynecology;  Laterality: Bilateral;    Family History  Problem Relation Age of Onset  . Hypertension Mother   . Cancer Mother        breast, colon  . Colon cancer Mother 61       surgery only  . Heart disease Father   . Diabetes Paternal Grandmother   . Hypertension Paternal Grandmother     Social History   Socioeconomic History  . Marital status: Married    Spouse name: Not on file  . Number of children: 1  . Years of education: Not on file  . Highest education level: Not on file  Occupational History  . Not on file  Tobacco Use  . Smoking status: Never Smoker  . Smokeless tobacco: Never Used  Substance and Sexual Activity  . Alcohol use: No  . Drug use: No  . Sexual activity: Yes    Birth control/protection: Surgical  Comment: tubal   Other Topics Concern  . Not on file  Social History Narrative  . Not on file   Social Determinants of Health   Financial Resource Strain:   . Difficulty of Paying Living Expenses:   Food Insecurity:   . Worried About Charity fundraiser in the Last Year:   . Arboriculturist in the Last Year:   Transportation Needs:   . Film/video editor (Medical):   Marland Kitchen Lack of Transportation (Non-Medical):   Physical Activity:   . Days of Exercise per Week:   . Minutes of Exercise per Session:   Stress:   . Feeling of Stress :   Social Connections:   . Frequency of Communication with Friends and Family:   . Frequency of Social Gatherings with Friends and Family:   . Attends Religious Services:   . Active Member of Clubs or Organizations:   . Attends Archivist  Meetings:   Marland Kitchen Marital Status:   Intimate Partner Violence:   . Fear of Current or Ex-Partner:   . Emotionally Abused:   Marland Kitchen Physically Abused:   . Sexually Abused:       ROS:  General: Negative for anorexia, weight loss, fever, chills, fatigue, weakness. Eyes: Negative for vision changes.  ENT: Negative for hoarseness, difficulty swallowing , nasal congestion. CV: Negative for chest pain, angina, palpitations, dyspnea on exertion, peripheral edema.  Respiratory: Negative for dyspnea at rest, dyspnea on exertion, cough, sputum, wheezing.  GI: See history of present illness. GU:  Negative for dysuria, hematuria, urinary incontinence, urinary frequency, nocturnal urination.  MS: Negative for joint pain, chronic intermittent low back pain.  Derm: Negative for rash or itching.  Neuro: Negative for weakness, abnormal sensation, seizure, frequent headaches, memory loss, confusion.  Psych: Negative for anxiety, depression, suicidal ideation, hallucinations.  Endo: Negative for unusual weight change.  Heme: Negative for bruising or bleeding. Allergy: Negative for rash or hives.    Physical Examination:  BP 123/76   Pulse 80   Temp 97.7 F (36.5 C) (Oral)   Ht 5\' 6"  (1.676 m)   Wt 159 lb 9.6 oz (72.4 kg)   LMP 01/11/2020   BMI 25.76 kg/m    General: Well-nourished, well-developed in no acute distress.  Head: Normocephalic, atraumatic.   Eyes: Conjunctiva pink, no icterus. Mouth: masked Neck: Supple without thyromegaly, masses, or lymphadenopathy.  Lungs: Clear to auscultation bilaterally.  Heart: Regular rate and rhythm, no murmurs rubs or gallops.  Abdomen: Bowel sounds are normal, nontender, nondistended, no hepatosplenomegaly or masses, no abdominal bruits or    hernia , no rebound or guarding.   Rectal: not performed Extremities: No lower extremity edema. No clubbing or deformities.  Neuro: Alert and oriented x 4 , grossly normal neurologically.  Skin: Warm and dry, no rash  or jaundice.   Psych: Alert and cooperative, normal mood and affect.    Imaging Studies: US PELVIS TRANSVAGINAL NON-OB (TV ONLY)  Result Date: 12/30/2019 GYNECOLOGIC SONOGRAM Deshon J Pain is a 44 y.o. G2P1011 Patient's last menstrual period was 12/02/2019 (exact date). She is here for a pelvic sonogram for irregular bleeding. Uterus                      6.9 x 4.6 x 5.3 cm, Total uterine volume 86 cc,homogeneous retroverted uterus,1.6 x 1.7 x 1.2 cm subserosal fibroid lower uterine segment Endometrium          13.2 mm, symmetrical, wnl Right  ovary             3.3 x 1.6 x 3.4 cm, wnl Left ovary                3.2 x 2.1 x 2 cm, wnl Small amount of simple cul de sac fluid Technician Comments: PELVIC US TA/TV:homogeneous retroverted uterus,1.6 x 1.7 x 1.2 cm subserosal fibroid lower uterine segment,EEC 13.2 mm,normal ovaries,ovaries appear mobile,no pain during ultrasound,small amount of simple cul de sac fluid Chaperone Sabrina Silver Huguenin 12/30/2019 5:01 PM Clinical Impression and recommendations: I have reviewed the sonogram results above.  U/s for evaluation of irregular bleeding. Now in secretory phase on cycle day 28, likely nearing menses. Combined with the patient's current clinical course, below are my impressions and any appropriate recommendations for management based on the sonographic findings: FINDINGS:                              Maternal uterus/adnexae                            : Ovaries are  within normal limits in size with multiple small subcortical follicles 4-8 mm diameter in a chain of pearls arrangement beneath both cortices                                                                       Right ovary normal size                                                                       Left ovary    Normal size, mobile.3                                                                       Free Fluid  Moderate clear CDS fluid.,                                         Secretory phase  endometrium with retroverted retroflexed uterus                                     Uniform endometrial stripe                                    One small lower uterine segment subserosal fibroid 1.7 cm max diameter not clinically a concern.  Multiple ovarian cysts , raising question of oligoovulation. REC:  If menses not imminent and normal, consider 14 days provera to effect through endometrial slough. May repeat up to 3 months to achieve optimal slough. Jonnie Kind 12/30/2019   US PELVIS (TRANSABDOMINAL ONLY)  Result Date: 12/30/2019 GYNECOLOGIC SONOGRAM Luellen J Fahey is a 44 y.o. G2P1011 Patient's last menstrual period was 12/02/2019 (exact date). She is here for a pelvic sonogram for irregular bleeding. Uterus                      6.9 x 4.6 x 5.3 cm, Total uterine volume 86 cc,homogeneous retroverted uterus,1.6 x 1.7 x 1.2 cm subserosal fibroid lower uterine segment Endometrium          13.2 mm, symmetrical, wnl Right ovary             3.3 x 1.6 x 3.4 cm, wnl Left ovary                3.2 x 2.1 x 2 cm, wnl Small amount of simple cul de sac fluid Technician Comments: PELVIC US TA/TV:homogeneous retroverted uterus,1.6 x 1.7 x 1.2 cm subserosal fibroid lower uterine segment,EEC 13.2 mm,normal ovaries,ovaries appear mobile,no pain during ultrasound,small amount of simple cul de sac fluid Chaperone Sabrina Silver Huguenin 12/30/2019 5:01 PM Clinical Impression and recommendations: I have reviewed the sonogram results above.  U/s for evaluation of irregular bleeding. Now in secretory phase on cycle day 28, likely nearing menses. Combined with the patient's current clinical course, below are my impressions and any appropriate recommendations for management based on the sonographic findings: FINDINGS:                              Maternal uterus/adnexae                            : Ovaries are  within normal limits in size with multiple small subcortical follicles 4-8 mm diameter in  a chain of pearls arrangement beneath both cortices                                                                       Right ovary normal size                                                                       Left ovary    Normal size, mobile.3                                                                       Free Fluid  Moderate clear CDS fluid.,  Secretory phase endometrium with retroverted retroflexed uterus                                     Uniform endometrial stripe                                    One small lower uterine segment subserosal fibroid 1.7 cm max diameter not clinically a concern.                                 Multiple ovarian cysts , raising question of oligoovulation. REC:  If menses not imminent and normal, consider 14 days provera to effect through endometrial slough. May repeat up to 3 months to achieve optimal slough. Jonnie Kind 12/30/2019

## 2020-01-20 NOTE — Patient Instructions (Signed)
1. Colonoscopy as scheduled. See separate instructions.  2. Based on current guidelines:  -->Your child would begin colonoscopies at age 42 (average risk). Having ONE second degree family member (ie grandparent, aunt/uncle, cousin) with colon cancer does not increase their risk.  -->HOWEVER, IF YOU HAVE PRECANCEROUS POLYPS OR COLON CANCER, YOUR CHILD WOULD NEED TO START COLONOSCOPIES AT AGE 23 OR 10 YEARS BEFORE THE AGE YOU HAD PRECANCEROUS POLYPS/COLON CANCER (WHICHEVER IS FIRST).

## 2020-01-26 ENCOUNTER — Telehealth: Payer: Self-pay | Admitting: *Deleted

## 2020-01-26 ENCOUNTER — Encounter: Payer: Self-pay | Admitting: *Deleted

## 2020-01-26 MED ORDER — NA SULFATE-K SULFATE-MG SULF 17.5-3.13-1.6 GM/177ML PO SOLN: 1.0000 | Freq: Once | ORAL | 0 refills | Status: AC

## 2020-01-26 NOTE — Telephone Encounter (Signed)
Called patient and she is scheduled for TCS with RMR on 6/10 at 1:45pm. Patient aware will mail prep instructions with her pre-op/covid test appt. Aware will send Rx to pharmacy. Confirmed address. Confirmed pharmacy [facility].

## 2020-02-01 ENCOUNTER — Other Ambulatory Visit: Payer: Self-pay

## 2020-02-01 ENCOUNTER — Ambulatory Visit (INDEPENDENT_AMBULATORY_CARE_PROVIDER_SITE_OTHER): Payer: BC Managed Care – PPO | Admitting: Adult Health

## 2020-02-01 ENCOUNTER — Encounter: Payer: Self-pay | Admitting: Adult Health

## 2020-02-01 VITALS — BP 111/75 | HR 94 | Ht 67.0 in | Wt 159.0 lb

## 2020-02-01 DIAGNOSIS — N926 Irregular menstruation, unspecified: Secondary | ICD-10-CM

## 2020-02-01 DIAGNOSIS — Z113 Encounter for screening for infections with a predominantly sexual mode of transmission: Secondary | ICD-10-CM

## 2020-02-01 NOTE — Progress Notes (Signed)
  Subjective:     Patient ID: Judy Thompson, female   DOB: 02-Oct-1975, 44 y.o.   MRN: [id number]  HPI Judy Thompson is a 44 year old white female, in requesting STD testing and bleeding has resolved, after provera.  PCP is Dr Gerarda Fraction.   Review of Systems Bleeding stopped after provera Wants STD testing, no symptoms Reviewed past medical,surgical, social and family history. Reviewed medications and allergies.     Objective:   Physical Exam . BP 111/75 (BP Location: Left Arm, Patient Position: Sitting, Cuff Size: Normal)   Pulse 94   Ht 5\' 7"  (1.702 m)   Wt 159 lb (72.1 kg)   LMP 01/11/2020   Breastfeeding No   BMI 24.90 kg/m  Skin warm and dry.Pelvic: external genitalia is normal in appearance no lesions, vagina: scant discharge without odor,urethra has no lesions or masses noted, cervix is  Bulbous,has nabothian cyst, uterus: normal size, shape and contour, non tender, no masses felt, adnexa: no masses or tenderness noted. Bladder is non tender and no masses felt. nuswb obtained Examination chaperoned by Levy Pupa LPN    Assessment:     STD screening,declines HIV and RPR Nuswabs sent  Irregular bleeding,resolved after provera     Plan:     Nuswab sent Follow up prn

## 2020-02-03 ENCOUNTER — Other Ambulatory Visit: Payer: Self-pay | Admitting: Adult Health

## 2020-02-03 LAB — NUSWAB VAGINITIS PLUS (VG+)
Candida albicans, NAA: NEGATIVE
Candida glabrata, NAA: NEGATIVE
Chlamydia trachomatis, NAA: NEGATIVE
Megasphaera 1: HIGH Score — AB
Neisseria gonorrhoeae, NAA: NEGATIVE
Trich vag by NAA: NEGATIVE

## 2020-02-03 MED ORDER — METRONIDAZOLE 500 MG PO TABS
500.0000 mg | ORAL_TABLET | Freq: Two times a day (BID) | ORAL | 0 refills | Status: DC
Start: 2020-02-03 — End: 2020-03-03

## 2020-02-03 NOTE — Progress Notes (Signed)
Rx flagyl for +BV on nuswab

## 2020-02-24 DIAGNOSIS — Z1389 Encounter for screening for other disorder: Secondary | ICD-10-CM | POA: Diagnosis not present

## 2020-02-24 DIAGNOSIS — G894 Chronic pain syndrome: Secondary | ICD-10-CM | POA: Diagnosis not present

## 2020-02-24 DIAGNOSIS — F419 Anxiety disorder, unspecified: Secondary | ICD-10-CM | POA: Diagnosis not present

## 2020-02-24 DIAGNOSIS — Z6824 Body mass index (BMI) 24.0-24.9, adult: Secondary | ICD-10-CM | POA: Diagnosis not present

## 2020-02-24 NOTE — Patient Instructions (Signed)
Your procedure is scheduled on: 03/03/2020  Report to [facility] at     12:15 PM.  Call this number if you have problems the morning of surgery: [phone]   Remember:              Follow Directions on the letter you received from Your Physician's office regarding the Bowel Prep              No Smoking the day of Procedure :   Take these medicines the morning of surgery with A SIP OF WATER:pantoprazole, Xyzal, xanax and/or oxycodone if needed    Do not wear jewelry, make-up or nail polish.    Do not bring valuables to the hospital.  Contacts, dentures or bridgework may not be worn into surgery.  .   Patients discharged the day of surgery will not be allowed to drive home.     Colonoscopy, Adult, Care After This sheet gives you information about how to care for yourself after your procedure. Your health care provider may also give you more specific instructions. If you have problems or questions, contact your health care provider. What can I expect after the procedure? After the procedure, it is common to have:  A small amount of blood in your stool for 24 hours after the procedure.  Some gas.  Mild abdominal cramping or bloating.  Follow these instructions at home: General instructions   For the first 24 hours after the procedure: ? Do not drive or use machinery. ? Do not sign important documents. ? Do not drink alcohol. ? Do your regular daily activities at a slower pace than normal. ? Eat soft, easy-to-digest foods. ? Rest often.  Take over-the-counter or prescription medicines only as told by your health care provider.  It is up to you to get the results of your procedure. Ask your health care provider, or the department performing the procedure, when your results will be ready. Relieving cramping and bloating  Try walking around when you have cramps or feel bloated.  Apply heat to your abdomen as told by your health care provider. Use a heat source that your  health care provider recommends, such as a moist heat pack or a heating pad. ? Place a towel between your skin and the heat source. ? Leave the heat on for 20-30 minutes. ? Remove the heat if your skin turns bright red. This is especially important if you are unable to feel pain, heat, or cold. You may have a greater risk of getting burned. Eating and drinking  Drink enough fluid to keep your urine clear or pale yellow.  Resume your normal diet as instructed by your health care provider. Avoid heavy or fried foods that are hard to digest.  Avoid drinking alcohol for as long as instructed by your health care provider. Contact a health care provider if:  You have blood in your stool 2-3 days after the procedure. Get help right away if:  You have more than a small spotting of blood in your stool.  You pass large blood clots in your stool.  Your abdomen is swollen.  You have nausea or vomiting.  You have a fever.  You have increasing abdominal pain that is not relieved with medicine. This information is not intended to replace advice given to you by your health care provider. Make sure you discuss any questions you have with your health care provider. Document Released: 04/24/2004 Document Revised: 06/04/2016 Document Reviewed: 11/22/2015 Elsevier Interactive  Patient Education  [employer].

## 2020-03-01 ENCOUNTER — Encounter (HOSPITAL_COMMUNITY)
Admission: RE | Admit: 2020-03-01 | Discharge: 2020-03-01 | Disposition: A | Discharge status: Home / Self Care | Payer: BC Managed Care – PPO | Source: Ambulatory Visit | Attending: Internal Medicine | Admitting: Internal Medicine

## 2020-03-01 ENCOUNTER — Other Ambulatory Visit: Payer: Self-pay

## 2020-03-01 ENCOUNTER — Encounter (HOSPITAL_COMMUNITY): Payer: Self-pay

## 2020-03-01 ENCOUNTER — Other Ambulatory Visit (HOSPITAL_COMMUNITY)
Admission: RE | Admit: 2020-03-01 | Discharge: 2020-03-01 | Disposition: A | Discharge status: Home / Self Care | Payer: BC Managed Care – PPO | Source: Ambulatory Visit | Attending: Internal Medicine | Admitting: Internal Medicine

## 2020-03-01 DIAGNOSIS — Z01812 Encounter for preprocedural laboratory examination: Secondary | ICD-10-CM | POA: Insufficient documentation

## 2020-03-01 DIAGNOSIS — Z20822 Contact with and (suspected) exposure to covid-19: Secondary | ICD-10-CM | POA: Diagnosis not present

## 2020-03-01 LAB — PREGNANCY, URINE: Preg Test, Ur: NEGATIVE

## 2020-03-02 LAB — SARS CORONAVIRUS 2 (TAT 6-24 HRS): SARS Coronavirus 2: NEGATIVE

## 2020-03-03 ENCOUNTER — Ambulatory Visit (HOSPITAL_COMMUNITY): Payer: BC Managed Care – PPO | Admitting: Anesthesiology

## 2020-03-03 ENCOUNTER — Encounter (HOSPITAL_COMMUNITY): Payer: Self-pay | Admitting: Internal Medicine

## 2020-03-03 ENCOUNTER — Ambulatory Visit (HOSPITAL_COMMUNITY)
Admission: RE | Admit: 2020-03-03 | Discharge: 2020-03-03 | Disposition: A | Discharge status: Home / Self Care | Payer: BC Managed Care – PPO | Attending: Internal Medicine | Admitting: Internal Medicine

## 2020-03-03 ENCOUNTER — Encounter (HOSPITAL_COMMUNITY)
Admission: RE | Disposition: A | Discharge status: Home / Self Care | Payer: Self-pay | Source: Home / Self Care | Attending: Internal Medicine

## 2020-03-03 DIAGNOSIS — G8929 Other chronic pain: Secondary | ICD-10-CM | POA: Insufficient documentation

## 2020-03-03 DIAGNOSIS — F419 Anxiety disorder, unspecified: Secondary | ICD-10-CM | POA: Insufficient documentation

## 2020-03-03 DIAGNOSIS — Z800 Family history of malignant neoplasm of digestive organs: Secondary | ICD-10-CM

## 2020-03-03 DIAGNOSIS — M549 Dorsalgia, unspecified: Secondary | ICD-10-CM | POA: Insufficient documentation

## 2020-03-03 DIAGNOSIS — K635 Polyp of colon: Secondary | ICD-10-CM

## 2020-03-03 DIAGNOSIS — D122 Benign neoplasm of ascending colon: Secondary | ICD-10-CM | POA: Insufficient documentation

## 2020-03-03 DIAGNOSIS — Z79899 Other long term (current) drug therapy: Secondary | ICD-10-CM | POA: Insufficient documentation

## 2020-03-03 DIAGNOSIS — Z1211 Encounter for screening for malignant neoplasm of colon: Secondary | ICD-10-CM | POA: Insufficient documentation

## 2020-03-03 HISTORY — PX: COLONOSCOPY WITH PROPOFOL: SHX5780

## 2020-03-03 HISTORY — PX: POLYPECTOMY: SHX5525

## 2020-03-03 SURGERY — COLONOSCOPY WITH PROPOFOL
Anesthesia: General

## 2020-03-03 MED ORDER — EPHEDRINE SULFATE 50 MG/ML IJ SOLN
INTRAMUSCULAR | Status: DC | PRN
  Administered 2020-03-03: 5 mg via INTRAVENOUS

## 2020-03-03 MED ORDER — LIDOCAINE HCL (CARDIAC) PF 50 MG/5ML IV SOSY
PREFILLED_SYRINGE | INTRAVENOUS | Status: DC | PRN
  Administered 2020-03-03: 100 mg via INTRAVENOUS

## 2020-03-03 MED ORDER — PROPOFOL 500 MG/50ML IV EMUL
INTRAVENOUS | Status: DC | PRN
  Administered 2020-03-03: 120 mg via INTRAVENOUS
  Administered 2020-03-03: 150 ug/kg/min via INTRAVENOUS

## 2020-03-03 MED ORDER — LACTATED RINGERS IV SOLN
Freq: Once | INTRAVENOUS | Status: AC
  Administered 2020-03-03: 1000 mL via INTRAVENOUS

## 2020-03-03 MED ORDER — CHLORHEXIDINE GLUCONATE CLOTH 2 % EX PADS: 6.0000 | MEDICATED_PAD | Freq: Once | CUTANEOUS | Status: DC

## 2020-03-03 MED ORDER — PROPOFOL 10 MG/ML IV BOLUS
INTRAVENOUS | Status: AC
  Filled 2020-03-03: qty 40

## 2020-03-03 MED ORDER — EPHEDRINE 5 MG/ML INJ
INTRAVENOUS | Status: AC
  Filled 2020-03-03: qty 10

## 2020-03-03 MED ORDER — LACTATED RINGERS IV SOLN: INTRAVENOUS | Status: DC | PRN

## 2020-03-03 NOTE — Anesthesia Preprocedure Evaluation (Signed)
Anesthesia Evaluation  Patient identified by MRN, date of birth, ID band Patient awake    Reviewed: Allergy & Precautions, NPO status , Patient's Chart, lab work & pertinent test results  History of Anesthesia Complications Negative for: history of anesthetic complications  Airway Mallampati: II  TM Distance: >3 FB Neck ROM: Full    Dental  (+) Caps, Dental Advisory Given   Pulmonary neg pulmonary ROS,    Pulmonary exam normal breath sounds clear to auscultation       Cardiovascular negative cardio ROS Normal cardiovascular exam Rhythm:Regular Rate:Normal     Neuro/Psych Anxiety negative neurological ROS  negative psych ROS   GI/Hepatic negative GI ROS, Neg liver ROS,   Endo/Other  negative endocrine ROS  Renal/GU negative Renal ROS  negative genitourinary   Musculoskeletal Chronic back pain    Abdominal   Peds negative pediatric ROS (+)  Hematology negative hematology ROS (+)   Anesthesia Other Findings   Reproductive/Obstetrics negative OB ROS                             Anesthesia Physical Anesthesia Plan  ASA: I  Anesthesia Plan: General   Post-op Pain Management:    Induction: Intravenous  PONV Risk Score and Plan: 2 and TIVA and Treatment may vary due to age or medical condition  Airway Management Planned: Nasal Cannula, Natural Airway and Simple Face Mask  Additional Equipment:   Intra-op Plan:   Post-operative Plan:   Informed Consent: I have reviewed the patients History and Physical, chart, labs and discussed the procedure including the risks, benefits and alternatives for the proposed anesthesia with the patient or authorized representative who has indicated his/her understanding and acceptance.     Dental advisory given  Plan Discussed with: CRNA and Surgeon  Anesthesia Plan Comments:         Anesthesia Quick Evaluation

## 2020-03-03 NOTE — H&P (Signed)
@LOGO @   Primary Care Physician:  Redmond School, MD Primary Gastroenterologist:  Dr. Gala Romney  Pre-Procedure History & Physical: HPI:  Judy Thompson is a 44 y.o. female is here for a screening colonoscopy.  Mother with colon cancer in her 71s.  No bowel symptoms currently.  No prior colonoscopy.  Past Medical History:  Diagnosis Date  . Allergies   . Anxiety   . Chronic back pain   . Medical history non-contributory     Past Surgical History:  Procedure Laterality Date  . BREAST BIOPSY Right 2018   Benign  . TUBAL LIGATION Bilateral 09/08/2016   Procedure: POST PARTUM TUBAL LIGATION;  Surgeon: Woodroe Mode, MD;  Location: [facility];  Service: Gynecology;  Laterality: Bilateral;    Prior to Admission medications   Medication Sig Start Date End Date Taking? Authorizing Provider  ALPRAZolam Duanne Moron) 0.5 MG tablet Take 0.5 mg by mouth 3 (three) times daily as needed for anxiety.  10/01/19  Yes [provider]  ibuprofen (ADVIL,MOTRIN) 600 MG tablet TAKE 1 TABLET (600 MG TOTAL) BY MOUTH EVERY 6 (SIX) HOURS. Patient taking differently: Take 600 mg by mouth every 6 (six) hours as needed for moderate pain. As needed 10/15/16  Yes Shelly Bombard, MD  levocetirizine (XYZAL) 5 MG tablet Take 5 mg by mouth daily. 05/22/16  Yes [provider]  Oxycodone HCl 10 MG TABS Take 10-20 mg by mouth 2 (two) times daily as needed (pain).  10/01/19  Yes [provider]  pantoprazole (PROTONIX) 40 MG tablet Take 40 mg by mouth daily. 07/30/19  Yes [provider]  metroNIDAZOLE (FLAGYL) 500 MG tablet Take 1 tablet (500 mg total) by mouth 2 (two) times daily. Patient not taking: Reported on 02/16/2020 02/03/20   Estill Dooms, NP    Allergies as of 01/26/2020  . (No Known Allergies)    Family History  Problem Relation Age of Onset  . Hypertension Mother   . Cancer Mother        breast, colon  . Colon cancer Mother 51       surgery only  . Heart disease  Father   . Diabetes Paternal Grandmother   . Hypertension Paternal Grandmother     Social History   Socioeconomic History  . Marital status: Married    Spouse name: Not on file  . Number of children: 1  . Years of education: Not on file  . Highest education level: Not on file  Occupational History  . Not on file  Tobacco Use  . Smoking status: Never Smoker  . Smokeless tobacco: Never Used  Vaping Use  . Vaping Use: Never used  Substance and Sexual Activity  . Alcohol use: No  . Drug use: No  . Sexual activity: Yes    Birth control/protection: Surgical    Comment: tubal   Other Topics Concern  . Not on file  Social History Narrative  . Not on file   Social Determinants of Health   Financial Resource Strain:   . Difficulty of Paying Living Expenses:   Food Insecurity:   . Worried About Charity fundraiser in the Last Year:   . Arboriculturist in the Last Year:   Transportation Needs:   . Film/video editor (Medical):   Marland Kitchen Lack of Transportation (Non-Medical):   Physical Activity:   . Days of Exercise per Week:   . Minutes of Exercise per Session:   Stress:   . Feeling  of Stress :   Social Connections:   . Frequency of Communication with Friends and Family:   . Frequency of Social Gatherings with Friends and Family:   . Attends Religious Services:   . Active Member of Clubs or Organizations:   . Attends Archivist Meetings:   Marland Kitchen Marital Status:   Intimate Partner Violence:   . Fear of Current or Ex-Partner:   . Emotionally Abused:   Marland Kitchen Physically Abused:   . Sexually Abused:     Review of Systems: See HPI, otherwise negative ROS  Physical Exam: LMP 02/10/2020  General:   Alert,  Well-developed, well-nourished, pleasant and cooperative in NAD Lungs:  Clear throughout to auscultation.   No wheezes, crackles, or rhonchi. No acute distress. Heart:  Regular rate and rhythm; no murmurs, clicks, rubs,  or gallops. Abdomen:  Soft, nontender and  nondistended. No masses, hepatosplenomegaly or hernias noted. Normal bowel sounds, without guarding, and without rebound.    Impression/Plan: [place] is now here to undergo a screening colonoscopy.  First ever high rescreening examination.  Risks, benefits, limitations, imponderables and alternatives regarding colonoscopy have been reviewed with the patient. Questions have been answered. All parties agreeable.     Notice:  This dictation was prepared with Dragon dictation along with smaller phrase technology. Any transcriptional errors that result from this process are unintentional and may not be corrected upon review.

## 2020-03-03 NOTE — Transfer of Care (Signed)
Immediate Anesthesia Transfer of Care Note  Patient: Judy Thompson  Procedure(s) Performed: COLONOSCOPY WITH PROPOFOL (N/A ) POLYPECTOMY  Patient Location: PACU  Anesthesia Type:MAC  Level of Consciousness: awake, alert , oriented and patient cooperative  Airway & Oxygen Therapy: Patient Spontanous Breathing and Patient connected to nasal cannula oxygen  Post-op Assessment: Report given to RN, Post -op Vital signs reviewed and stable and Patient moving all extremities  Post vital signs: Reviewed and stable  Last Vitals:  Vitals Value Taken Time  BP    Temp    Pulse    Resp    SpO2      Last Pain:  Vitals:   03/03/20 1245  TempSrc:   PainSc: 0-No pain      Patients Stated Pain Goal: 8 (32/95/18 8416)  Complications: No complications documented.

## 2020-03-03 NOTE — Op Note (Signed)
[facility] Patient Name: Judy Thompson Procedure Date: 03/03/2020 10:41 AM MRN: [id number] Date of Birth: [birth year] Attending MD: Norvel Richards , MD CSN: [id number] Age: 44 Admit Type: Outpatient Procedure:                Colonoscopy Indications:              Screening in patient at increased risk: Family                            history of 1st-degree relative with colorectal                            cancer before age 74 years Providers:                Norvel Richards, MD, Janeece Riggers, RN, Lorenzo                            Page, Aram Candela Referring MD:              Medicines:                Propofol per Anesthesia Complications:            No immediate complications. Estimated Blood Loss:     Estimated blood loss: none. Procedure:                Pre-Anesthesia Assessment:                           - Prior to the procedure, a History and Physical                            was performed, and patient medications and                            allergies were reviewed. The patient's tolerance of                            previous anesthesia was also reviewed. The risks                            and benefits of the procedure and the sedation                            options and risks were discussed with the patient.                            All questions were answered, and informed consent                            was obtained. Prior Anticoagulants: The patient has                            taken no previous anticoagulant or antiplatelet  agents. ASA Grade Assessment: II - A patient with                            mild systemic disease. After reviewing the risks                            and benefits, the patient was deemed in                            satisfactory condition to undergo the procedure.                           After obtaining informed consent, the colonoscope                            was passed under  direct vision. Throughout the                            procedure, the patient's blood pressure, pulse, and                            oxygen saturations were monitored continuously. The                            CF-HQ190L ([id number]) scope was introduced through                            the anus and advanced to the the cecum, identified                            by appendiceal orifice and ileocecal valve. The                            colonoscopy was performed without difficulty. The                            patient tolerated the procedure well. The quality                            of the bowel preparation was adequate. The entire                            colon was well visualized. Scope In: 12:47:55 PM Scope Out: 12:57:32 PM Scope Withdrawal Time: 0 hours 7 minutes 7 seconds  Total Procedure Duration: 0 hours 9 minutes 37 seconds  Findings:      The perianal and digital rectal examinations were normal.      A 4 mm polyp was found in the ascending colon. The polyp was       semi-pedunculated. The polyp was removed with a cold snare. Resection       and retrieval were complete. Estimated blood loss was minimal.      The exam was otherwise without abnormality on direct and retroflexion       views. Impression:               -  One 4 mm polyp in the ascending colon, removed                            with a cold snare. Resected and retrieved.                           - The examination was otherwise normal on direct                            and retroflexion views. Moderate Sedation:      Moderate (conscious) sedation was personally administered by an       anesthesia professional. The following parameters were monitored: oxygen       saturation, heart rate, blood pressure, respiratory rate, EKG, adequacy       of pulmonary ventilation, and response to care. Recommendation:           - Patient has a contact number available for                            emergencies. The signs  and symptoms of potential                            delayed complications were discussed with the                            patient. Return to normal activities tomorrow.                            Written discharge instructions were provided to the                            patient.                           - Resume previous diet.                           - Continue present medications.                           - Repeat colonoscopy date to be determined after                            pending pathology results are reviewed for                            surveillance.                           - Return to GI office (date not yet determined). Procedure Code(s):        --- Professional ---                           [phone], Colonoscopy, flexible; with removal of  tumor(s), polyp(s), or other lesion(s) by snare                            technique Diagnosis Code(s):        --- Professional ---                           Z80.0, Family history of malignant neoplasm of                            digestive organs                           K63.5, Polyp of colon CPT copyright 2019 American [employer] Association. All rights reserved. The codes documented in this report are preliminary and upon coder review may  be revised to meet current compliance requirements. Cristopher Estimable. Isabellarose Kope, MD Norvel Richards, MD 03/03/2020 1:04:17 PM This report has been signed electronically. Number of Addenda: 0

## 2020-03-03 NOTE — Anesthesia Postprocedure Evaluation (Signed)
Anesthesia Post Note  Patient: Judy Thompson  Procedure(s) Performed: COLONOSCOPY WITH PROPOFOL (N/A ) POLYPECTOMY  Patient location during evaluation: PACU Anesthesia Type: General Level of consciousness: awake, oriented and awake and alert Pain management: pain level controlled Vital Signs Assessment: post-procedure vital signs reviewed and stable Respiratory status: spontaneous breathing, respiratory function stable and nonlabored ventilation Cardiovascular status: blood pressure returned to baseline and stable Postop Assessment: no headache and no backache Anesthetic complications: no   No complications documented.   Last Vitals:  Vitals:   03/03/20 1230 03/03/20 1235  BP: 104/71   Pulse: 66 72  Resp: 15 (!) 21  Temp:    SpO2: 98% 99%    Last Pain:  Vitals:   03/03/20 1245  TempSrc:   PainSc: 0-No pain                 Tacy Learn

## 2020-03-03 NOTE — Discharge Instructions (Addendum)
Colon Polyps  Polyps are tissue growths inside the body. Polyps can grow in many places, including the large intestine (colon). A polyp may be a round bump or a mushroom-shaped growth. You could have one polyp or several. Most colon polyps are noncancerous (benign). However, some colon polyps can become cancerous over time. Finding and removing the polyps early can help prevent this. What are the causes? The exact cause of colon polyps is not known. What increases the risk? You are more likely to develop this condition if you:  Have a family history of colon cancer or colon polyps.  Are older than 55 or older than 45 if you are African American.  Have inflammatory bowel disease, such as ulcerative colitis or Crohn's disease.  Have certain hereditary conditions, such as: ? Familial adenomatous polyposis. ? Lynch syndrome. ? Turcot syndrome. ? Peutz-Jeghers syndrome.  Are overweight.  Smoke cigarettes.  Do not get enough exercise.  Drink too much alcohol.  Eat a diet that is high in fat and red meat and low in fiber.  Had childhood cancer that was treated with abdominal radiation. What are the signs or symptoms? Most polyps do not cause symptoms. If you have symptoms, they may include:  Blood coming from your rectum when having a bowel movement.  Blood in your stool. The stool may look dark red or black.  Abdominal pain.  A change in bowel habits, such as constipation or diarrhea. How is this diagnosed? This condition is diagnosed with a colonoscopy. This is a procedure in which a lighted, flexible scope is inserted into the anus and then passed into the colon to examine the area. Polyps are sometimes found when a colonoscopy is done as part of routine cancer screening tests. How is this treated? Treatment for this condition involves removing any polyps that are found. Most polyps can be removed during a colonoscopy. Those polyps will then be tested for cancer. Additional  treatment may be needed depending on the results of testing. Follow these instructions at home: Lifestyle  Maintain a healthy weight, or lose weight if recommended by your health care provider.  Exercise every day or as told by your health care provider.  Do not use any products that contain nicotine or tobacco, such as cigarettes and e-cigarettes. If you need help quitting, ask your health care provider.  If you drink alcohol, limit how much you have: ? 0-1 drink a day for women. ? 0-2 drinks a day for men.  Be aware of how much alcohol is in your drink. In the [place] one drink equals one 12 oz bottle of beer (355 mL), one 5 oz glass of wine (148 mL), or one 1 oz shot of hard liquor (44 mL). Eating and drinking   Eat foods that are high in fiber, such as fruits, vegetables, and whole grains.  Eat foods that are high in calcium and vitamin D, such as milk, cheese, yogurt, eggs, liver, fish, and broccoli.  Limit foods that are high in fat, such as fried foods and desserts.  Limit the amount of red meat and processed meat you eat, such as hot dogs, sausage, bacon, and lunch meats. General instructions  Keep all follow-up visits as told by your health care provider. This is important. ? This includes having regularly scheduled colonoscopies. ? Talk to your health care provider about when you need a colonoscopy. Contact a health care provider if:  You have new or worsening bleeding during a bowel movement.  You  have new or increased blood in your stool.  You have a change in bowel habits.  You lose weight for no known reason. Summary  Polyps are tissue growths inside the body. Polyps can grow in many places, including the colon.  Most colon polyps are noncancerous (benign), but some can become cancerous over time.  This condition is diagnosed with a colonoscopy.  Treatment for this condition involves removing any polyps that are found. Most polyps can be removed during a  colonoscopy. This information is not intended to replace advice given to you by your health care provider. Make sure you discuss any questions you have with your health care provider. Document Revised: 12/26/2017 Document Reviewed: 12/26/2017 Elsevier Patient Education  [place].  Colonoscopy Discharge Instructions  Read the instructions outlined below and refer to this sheet in the next few weeks. These discharge instructions provide you with general information on caring for yourself after you leave the hospital. Your doctor may also give you specific instructions. While your treatment has been planned according to the most current medical practices available, unavoidable complications occasionally occur. If you have any problems or questions after discharge, call Dr. Gala Romney at [phone]. ACTIVITY  You may resume your regular activity, but move at a slower pace for the next 24 hours.   Take frequent rest periods for the next 24 hours.   Walking will help get rid of the air and reduce the bloated feeling in your belly (abdomen).   No driving for 24 hours (because of the medicine (anesthesia) used during the test).    Do not sign any important legal documents or operate any machinery for 24 hours (because of the anesthesia used during the test).  NUTRITION  Drink plenty of fluids.   You may resume your normal diet as instructed by your doctor.   Begin with a light meal and progress to your normal diet. Heavy or fried foods are harder to digest and may make you feel sick to your stomach (nauseated).   Avoid alcoholic beverages for 24 hours or as instructed.  MEDICATIONS  You may resume your normal medications unless your doctor tells you otherwise.  WHAT YOU CAN EXPECT TODAY  Some feelings of bloating in the abdomen.   Passage of more gas than usual.   Spotting of blood in your stool or on the toilet paper.  IF YOU HAD POLYPS REMOVED DURING THE COLONOSCOPY:  No aspirin  products for 7 days or as instructed.   No alcohol for 7 days or as instructed.   Eat a soft diet for the next 24 hours.  FINDING OUT THE RESULTS OF YOUR TEST Not all test results are available during your visit. If your test results are not back during the visit, make an appointment with your caregiver to find out the results. Do not assume everything is normal if you have not heard from your caregiver or the medical facility. It is important for you to follow up on all of your test results.  SEEK IMMEDIATE MEDICAL ATTENTION IF:  You have more than a spotting of blood in your stool.   Your belly is swollen (abdominal distention).   You are nauseated or vomiting.   You have a temperature over 101.   You have abdominal pain or discomfort that is severe or gets worse throughout the day.    Single polyp removed from your colon today  Further recommendations to follow pending review of pathology report  At patient request, I  called Jones Broom at [phone] and reviewed results

## 2020-03-04 ENCOUNTER — Encounter: Payer: Self-pay | Admitting: Internal Medicine

## 2020-03-04 LAB — SURGICAL PATHOLOGY

## 2020-03-08 ENCOUNTER — Encounter (HOSPITAL_COMMUNITY): Payer: Self-pay | Admitting: Internal Medicine

## 2020-04-04 DIAGNOSIS — F419 Anxiety disorder, unspecified: Secondary | ICD-10-CM | POA: Diagnosis not present

## 2020-04-04 DIAGNOSIS — Z6824 Body mass index (BMI) 24.0-24.9, adult: Secondary | ICD-10-CM | POA: Diagnosis not present

## 2020-04-04 DIAGNOSIS — G894 Chronic pain syndrome: Secondary | ICD-10-CM | POA: Diagnosis not present

## 2020-05-03 DIAGNOSIS — G894 Chronic pain syndrome: Secondary | ICD-10-CM | POA: Diagnosis not present

## 2020-05-27 DIAGNOSIS — G894 Chronic pain syndrome: Secondary | ICD-10-CM | POA: Diagnosis not present

## 2020-06-27 DIAGNOSIS — F419 Anxiety disorder, unspecified: Secondary | ICD-10-CM | POA: Diagnosis not present

## 2020-06-27 DIAGNOSIS — Z6824 Body mass index (BMI) 24.0-24.9, adult: Secondary | ICD-10-CM | POA: Diagnosis not present

## 2020-06-27 DIAGNOSIS — G894 Chronic pain syndrome: Secondary | ICD-10-CM | POA: Diagnosis not present

## 2020-06-27 DIAGNOSIS — Z23 Encounter for immunization: Secondary | ICD-10-CM | POA: Diagnosis not present

## 2020-07-08 DIAGNOSIS — S99912A Unspecified injury of left ankle, initial encounter: Secondary | ICD-10-CM | POA: Diagnosis not present

## 2020-07-28 ENCOUNTER — Ambulatory Visit (INDEPENDENT_AMBULATORY_CARE_PROVIDER_SITE_OTHER): Payer: Worker's Compensation | Admitting: Orthopaedic Surgery

## 2020-07-28 ENCOUNTER — Ambulatory Visit: Payer: Self-pay

## 2020-07-28 ENCOUNTER — Encounter: Payer: Self-pay | Admitting: Orthopaedic Surgery

## 2020-07-28 ENCOUNTER — Other Ambulatory Visit: Payer: Self-pay

## 2020-07-28 VITALS — BP 108/72 | HR 78 | Ht 68.0 in | Wt 160.0 lb

## 2020-07-28 DIAGNOSIS — S93409A Sprain of unspecified ligament of unspecified ankle, initial encounter: Secondary | ICD-10-CM | POA: Diagnosis not present

## 2020-07-28 DIAGNOSIS — M25572 Pain in left ankle and joints of left foot: Secondary | ICD-10-CM | POA: Diagnosis not present

## 2020-07-28 NOTE — Progress Notes (Signed)
Office Visit Note   Patient: Judy Thompson           Date of Birth: 22-Sep-1976           MRN: [id number] Visit Date: 07/28/2020              Requested by: [employer] [employer], [place],  [place] [address] PCP: Redmond School, MD   Assessment & Plan: Visit Diagnoses:  1. Pain in left ankle and joints of left foot   2. 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  Work slip given no work x3 weeks recheck 3 weeks.  If modified work with minimal walking is available then we could reconsider this.  Follow-Up Instructions: Return in about 3 weeks (around 08/18/2020).   Orders:  Orders Placed This Encounter  Procedures  . XR Ankle Complete Left   No orders of the defined types were placed in this encounter.     Procedures: No procedures performed   Clinical Data: No additional findings.   Subjective: Chief Complaint  Patient presents with  . 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  She had sharp pain difficulty walking was seen at urgent care [facility] and x-rays demonstrated questionable accessory ossicle versus tiny chip avulsion of the tip of the fibula.  She is taking over-the-counter medication has a short cam boot on and is here with Judy Thompson case manager fax number [phone].  Patient's been out of work.  Patient is using cam boot no crutches or walker.  Review of Systemsnoncontributory   Objective: Vital Signs: BP 108/72   Pulse 78   Ht 5\' 8"  (1.727 m)   Wt 160 lb (72.6 kg)   BMI 24.33 kg/m   Physical Exam Constitutional:      Appearance: She is well-developed.  HENT:     Head: Normocephalic.     Right Ear: External ear normal.     Left Ear: External ear normal.  Eyes:     Pupils: Pupils  are equal, round, and reactive to light.  Neck:     Thyroid: No thyromegaly.     Trachea: No tracheal deviation.  Cardiovascular:     Rate and Rhythm: Normal rate.  Pulmonary:     Effort: Pulmonary effort is normal.  Abdominal:     Palpations: Abdomen is soft.  Skin:    General: Skin is warm and dry.  Neurological:     Mental Status: She is alert and oriented to person, place, and time.  Psychiatric:        Behavior: Behavior normal.     Ortho Exam patient has negative anterior drawer.  There is no ecchymosis.  Mild swelling laterally.  No tenderness medially over the deltoid posterior tib peroneals are normal base of the fifth is normal.  No plantar foot lesions.  Anterior talar fib and distal tib-fib is tender both areas.  Specialty Comments:  No specialty comments available.  Imaging: No results found.   PMFS History: Patient Active Problem List   Diagnosis Date Noted  . Sprain of lateral ligament of ankle joint 07/28/2020  . Screening examination for STD (sexually transmitted disease) 02/01/2020  . Screening for colorectal cancer 12/16/2019  . Encounter for  well woman exam with routine gynecological exam 12/16/2019  . Vaginal odor 10/13/2019  . Vaginal discharge 10/13/2019  . Irregular bleeding 10/13/2019  . Urine pregnancy test negative 10/13/2019  . BV (bacterial vaginosis) 10/13/2019  . Family history of breast cancer in mother 08/07/2018  . Family history of colon cancer in mother 08/07/2018  . Trichomonas infection 07/07/2018   Past Medical History:  Diagnosis Date  . Allergies   . Anxiety   . Chronic back pain   . Medical history non-contributory     Family History  Problem Relation Age of Onset  . Hypertension Mother   . Cancer Mother        breast, colon  . Colon cancer Mother 73       surgery only  . Heart disease Father   . Diabetes Paternal Grandmother   . Hypertension Paternal Grandmother     Past Surgical History:  Procedure Laterality  Date  . BREAST BIOPSY Right 2018   Benign  . COLONOSCOPY WITH PROPOFOL N/A 03/03/2020   Procedure: COLONOSCOPY WITH PROPOFOL;  Surgeon: Daneil Dolin, MD;  [facility];  Service: Endoscopy;  Laterality: N/A;  1:45pm  . POLYPECTOMY  03/03/2020   Procedure: POLYPECTOMY;  Surgeon: Daneil Dolin, MD;  [facility];  Service: Endoscopy;;  . TUBAL LIGATION Bilateral 09/08/2016   Procedure: POST PARTUM TUBAL LIGATION;  Surgeon: Woodroe Mode, MD;  Location: [facility];  Service: Gynecology;  Laterality: Bilateral;   Social History   Occupational History  . Not on file  Tobacco Use  . Smoking status: Never Smoker  . Smokeless tobacco: Never Used  Vaping Use  . Vaping Use: Never used  Substance and Sexual Activity  . Alcohol use: No  . Drug use: No  . Sexual activity: Yes    Birth control/protection: Surgical    Comment: tubal

## 2020-08-03 DIAGNOSIS — G894 Chronic pain syndrome: Secondary | ICD-10-CM | POA: Diagnosis not present

## 2020-08-03 DIAGNOSIS — Z681 Body mass index (BMI) 19 or less, adult: Secondary | ICD-10-CM | POA: Diagnosis not present

## 2020-08-11 ENCOUNTER — Other Ambulatory Visit: Payer: Self-pay

## 2020-08-11 ENCOUNTER — Ambulatory Visit (INDEPENDENT_AMBULATORY_CARE_PROVIDER_SITE_OTHER): Payer: Worker's Compensation | Admitting: Orthopaedic Surgery

## 2020-08-11 ENCOUNTER — Encounter: Payer: Self-pay | Admitting: Orthopaedic Surgery

## 2020-08-11 VITALS — Ht 68.0 in | Wt 160.0 lb

## 2020-08-11 DIAGNOSIS — S93402A Sprain of unspecified ligament of left ankle, initial encounter: Secondary | ICD-10-CM

## 2020-08-11 DIAGNOSIS — S93409A Sprain of unspecified ligament of unspecified ankle, initial encounter: Secondary | ICD-10-CM

## 2020-08-11 NOTE — Progress Notes (Signed)
Office Visit Note   Patient: Judy Thompson           Date of Birth: November 19, 1975           MRN: [id number] Visit Date: 08/11/2020              Requested by: [employer], [place],  [place] [address] PCP: Redmond School, MD   Assessment & Plan: Visit Diagnoses:  1. Sprain of lateral ligament of ankle joint     Plan: Work slip given for 2 hours/day x 2 weeks, 3 hours/day x 2 weeks then full days.  I plan to recheck her in 4 weeks.  On return we will repeat x-rays of her left ankle 3 view on return.  Outlined plan discussed with rehab nurse.  Follow-Up Instructions: No follow-ups on file.   Orders:  No orders of the defined types were placed in this encounter.  No orders of the defined types were placed in this encounter.     Procedures: No procedures performed   Clinical Data: No additional findings.   Subjective: Chief Complaint  Patient presents with  . Left Ankle - Fracture, Follow-up    OTJI 07/08/2020  . 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  Patient is in a short cam boot she is walking better.  She went with her mother and states that after about an hour and a half she has had significant increased discomfort.  She is here with rehab nurse who states part-time work is available.  Patient normally works maximum 30 hours/week.  Review of Systems updated unchanged.   Objective: Vital Signs: Ht 5\' 8"  (1.727 m)   Wt 160 lb (72.6 kg)   BMI 24.33 kg/m   Physical Exam Constitutional:      Appearance: She is well-developed.  HENT:     Head: Normocephalic.     Right Ear: External ear normal.     Left Ear: External ear normal.  Eyes:     Pupils: Pupils are equal, round, and reactive to light.  Neck:     Thyroid: No thyromegaly.     Trachea: No tracheal deviation.  Cardiovascular:     Rate and  Rhythm: Normal rate.  Pulmonary:     Effort: Pulmonary effort is normal.  Abdominal:     Palpations: Abdomen is soft.  Skin:    General: Skin is warm and dry.  Neurological:     Mental Status: She is alert and oriented to person, place, and time.  Psychiatric:        Behavior: Behavior normal.     Ortho Exam patient can ambulate in the boot without pronounced limp she has some ankle range of motion even in the cam boot without pain.  Specialty Comments:  No specialty comments available.  Imaging: No results found.   PMFS History: Patient Active Problem List   Diagnosis Date Noted  . Sprain of lateral ligament of ankle joint 07/28/2020  . Screening examination for STD (sexually transmitted disease) 02/01/2020  . Screening for colorectal cancer 12/16/2019  . Encounter for well woman exam with routine gynecological exam 12/16/2019  . Vaginal odor 10/13/2019  . Vaginal discharge 10/13/2019  . Irregular bleeding 10/13/2019  . Urine pregnancy test negative 10/13/2019  . BV (bacterial vaginosis) 10/13/2019  . Family history of breast cancer in mother 08/07/2018  .  Family history of colon cancer in mother 08/07/2018  . Trichomonas infection 07/07/2018   Past Medical History:  Diagnosis Date  . Allergies   . Anxiety   . Chronic back pain   . Medical history non-contributory     Family History  Problem Relation Age of Onset  . Hypertension Mother   . Cancer Mother        breast, colon  . Colon cancer Mother 66       surgery only  . Heart disease Father   . Diabetes Paternal Grandmother   . Hypertension Paternal Grandmother     Past Surgical History:  Procedure Laterality Date  . BREAST BIOPSY Right 2018   Benign  . COLONOSCOPY WITH PROPOFOL N/A 03/03/2020   Procedure: COLONOSCOPY WITH PROPOFOL;  Surgeon: Daneil Dolin, MD;  [facility];  Service: Endoscopy;  Laterality: N/A;  1:45pm  . POLYPECTOMY  03/03/2020   Procedure: POLYPECTOMY;  Surgeon: Daneil Dolin, MD;  [facility];  Service: Endoscopy;;  . TUBAL LIGATION Bilateral 09/08/2016   Procedure: POST PARTUM TUBAL LIGATION;  Surgeon: Woodroe Mode, MD;  Location: [facility];  Service: Gynecology;  Laterality: Bilateral;   Social History   Occupational History  . Not on file  Tobacco Use  . Smoking status: Never Smoker  . Smokeless tobacco: Never Used  Vaping Use  . Vaping Use: Never used  Substance and Sexual Activity  . Alcohol use: No  . Drug use: No  . Sexual activity: Yes    Birth control/protection: Surgical    Comment: tubal

## 2020-09-08 ENCOUNTER — Ambulatory Visit (INDEPENDENT_AMBULATORY_CARE_PROVIDER_SITE_OTHER): Payer: Worker's Compensation | Admitting: Orthopaedic Surgery

## 2020-09-08 ENCOUNTER — Ambulatory Visit: Payer: Self-pay

## 2020-09-08 ENCOUNTER — Other Ambulatory Visit: Payer: Self-pay

## 2020-09-08 ENCOUNTER — Encounter: Payer: Self-pay | Admitting: Orthopaedic Surgery

## 2020-09-08 VITALS — Ht 68.0 in | Wt 160.0 lb

## 2020-09-08 DIAGNOSIS — S93402A Sprain of unspecified ligament of left ankle, initial encounter: Secondary | ICD-10-CM

## 2020-09-08 DIAGNOSIS — S93409A Sprain of unspecified ligament of unspecified ankle, initial encounter: Secondary | ICD-10-CM

## 2020-09-08 NOTE — Progress Notes (Signed)
Office Visit Note   Patient: Judy Thompson           Date of Birth: July 16, 1976           MRN: [id number] Visit Date: 09/08/2020              Requested by: [address], [place],  [address] [address] PCP: Redmond School, MD   Assessment & Plan: Visit Diagnoses:  1. Sprain of lateral ligament of ankle joint     Plan: Lace up [name] applied. She can use at some at home with her tennis shoe. She can use her short cam brace as she increases her work ramping up now to 4 hours/day 5 days a week x3 weeks. I will recheck her in 3 weeks. She can need to use the cam boot or the lace up [name] with her tennis shoe with work activity.  Follow-Up Instructions: Return in about 3 weeks (around 09/29/2020).   Orders:  Orders Placed This Encounter  Procedures  . XR Ankle Complete Left   No orders of the defined types were placed in this encounter.     Procedures: No procedures performed   Clinical Data: No additional findings.   Subjective: Chief Complaint  Patient presents with  . 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 She is using a short cam boot is been working 2 hours/day 5 days a week. She still has swelling at the end of the day that she has noticed. New repeat x-rays taken today show good position and alignment. Patient had old smooth anterior talofibular chip avulsion that was old injury. Patient is amatory without crutches.  Review of Systems updated unchanged.   Objective: Vital Signs: Ht 5\' 8"  (1.727 m)   Wt 160 lb (72.6 kg)   BMI 24.33 kg/m   Physical Exam Constitutional:      Appearance: She is well-developed.  HENT:     Head: Normocephalic.     Right Ear: External ear normal.     Left Ear: External ear normal.  Eyes:     Pupils: Pupils are equal, round, and reactive to light.  Neck:     Thyroid: No  thyromegaly.     Trachea: No tracheal deviation.  Cardiovascular:     Rate and Rhythm: Normal rate.  Pulmonary:     Effort: Pulmonary effort is normal.  Abdominal:     Palpations: Abdomen is soft.  Skin:    General: Skin is warm and dry.  Neurological:     Mental Status: She is alert and oriented to person, place, and time.  Psychiatric:        Mood and Affect: Mood and affect normal.        Behavior: Behavior normal.     Ortho Exam patient has trace ankle swelling. She is able take a few steps on her toes. Still mild tenderness laterally in the ankle joint. No hitting edema the lower extremity. Knee has full range of motion. No rash over exposed skin. Specialty Comments:  No specialty comments available.  Imaging: No results found.   PMFS History: Patient Active Problem List   Diagnosis Date Noted  . Sprain of lateral ligament of ankle joint 07/28/2020  . Screening examination for STD (sexually transmitted disease) 02/01/2020  . Screening for colorectal cancer 12/16/2019  . Encounter for well woman exam with routine gynecological  exam 12/16/2019  . Vaginal odor 10/13/2019  . Vaginal discharge 10/13/2019  . Irregular bleeding 10/13/2019  . Urine pregnancy test negative 10/13/2019  . BV (bacterial vaginosis) 10/13/2019  . Family history of breast cancer in mother 08/07/2018  . Family history of colon cancer in mother 08/07/2018  . Trichomonas infection 07/07/2018   Past Medical History:  Diagnosis Date  . Allergies   . Anxiety   . Chronic back pain   . Medical history non-contributory     Family History  Problem Relation Age of Onset  . Hypertension Mother   . Cancer Mother        breast, colon  . Colon cancer Mother 60       surgery only  . Heart disease Father   . Diabetes Paternal Grandmother   . Hypertension Paternal Grandmother     Past Surgical History:  Procedure Laterality Date  . BREAST BIOPSY Right 2018   Benign  . COLONOSCOPY WITH PROPOFOL N/A  03/03/2020   Procedure: COLONOSCOPY WITH PROPOFOL;  Surgeon: Daneil Dolin, MD;  [facility];  Service: Endoscopy;  Laterality: N/A;  1:45pm  . POLYPECTOMY  03/03/2020   Procedure: POLYPECTOMY;  Surgeon: Daneil Dolin, MD;  [facility];  Service: Endoscopy;;  . TUBAL LIGATION Bilateral 09/08/2016   Procedure: POST PARTUM TUBAL LIGATION;  Surgeon: Woodroe Mode, MD;  Location: [facility];  Service: Gynecology;  Laterality: Bilateral;   Social History   Occupational History  . Not on file  Tobacco Use  . Smoking status: Never Smoker  . Smokeless tobacco: Never Used  Vaping Use  . Vaping Use: Never used  Substance and Sexual Activity  . Alcohol use: No  . Drug use: No  . Sexual activity: Yes    Birth control/protection: Surgical    Comment: tubal

## 2020-09-29 ENCOUNTER — Ambulatory Visit: Payer: BC Managed Care – PPO | Admitting: Orthopaedic Surgery

## 2020-09-29 DIAGNOSIS — G894 Chronic pain syndrome: Secondary | ICD-10-CM | POA: Diagnosis not present

## 2020-10-11 ENCOUNTER — Telehealth: Payer: Self-pay | Admitting: Adult Health
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
## 2020-10-13 ENCOUNTER — Encounter: Payer: Self-pay | Admitting: Orthopaedic Surgery

## 2020-10-13 ENCOUNTER — Other Ambulatory Visit: Payer: Self-pay

## 2020-10-13 ENCOUNTER — Ambulatory Visit (INDEPENDENT_AMBULATORY_CARE_PROVIDER_SITE_OTHER): Payer: Worker's Compensation | Admitting: Orthopaedic Surgery

## 2020-10-13 VITALS — Ht 68.0 in | Wt 160.0 lb

## 2020-10-13 DIAGNOSIS — S93409A Sprain of unspecified ligament of unspecified ankle, initial encounter: Secondary | ICD-10-CM

## 2020-10-13 DIAGNOSIS — S93402A Sprain of unspecified ligament of left ankle, initial encounter: Secondary | ICD-10-CM | POA: Diagnosis not present

## 2020-10-13 NOTE — Progress Notes (Signed)
Office Visit Note   Patient: Judy Thompson           Date of Birth: 03/17/1976           MRN: [id number] Visit Date: 10/13/2020              Requested by: Redmond School, [address] [place],  [place] [address] PCP: Redmond School, MD   Assessment & Plan: Visit Diagnoses:  1. Sprain of lateral ligament of ankle joint     Plan: Patient [name] got wet.  We discussed proper way to dry it.  Work slip given increasing her 6 hours/day.  I plan to recheck her in 3 to 4 weeks.  Physical therapy ordered and [place] where she lives.  Rehab nurse was present for her visit today.  Follow-Up Instructions: No follow-ups on file.   Orders:  No orders of the defined types were placed in this encounter.  No orders of the defined types were placed in this encounter.     Procedures: No procedures performed   Clinical Data: No additional findings.   Subjective: Chief Complaint  Patient presents with  . Left Ankle - Follow-up    OTJI 07/08/2020    HPI 45 year old female returns post on-the-job injury with ankle sprain injury date was 07/08/2020.  She has been working 4 hours/day.  ASO got wet and she is not wearing it today.  She is amatory without limp but states she still feels like she has a lot of stiffness in her ankle and has been using ibuprofen.  Review of Systems all other systems update unchanged from previous visit.   Objective: Vital Signs: Ht 5\' 8"  (1.727 m)   Wt 160 lb (72.6 kg)   BMI 24.33 kg/m   Physical Exam Constitutional:      Appearance: She is well-developed.  HENT:     Head: Normocephalic.     Right Ear: External ear normal.     Left Ear: External ear normal.  Eyes:     Pupils: Pupils are equal, round, and reactive to light.  Neck:     Thyroid: No thyromegaly.     Trachea: No tracheal deviation.  Cardiovascular:     Rate and Rhythm: Normal rate.  Pulmonary:     Effort: Pulmonary effort is normal.  Abdominal:     Palpations:  Abdomen is soft.  Skin:    General: Skin is warm and dry.  Neurological:     Mental Status: She is alert and oriented to person, place, and time.  Psychiatric:        Mood and Affect: Mood and affect normal.        Behavior: Behavior normal.     Ortho Exam patient has no ankle effusion.  She has some tightness of the heel cord can plantarflex just past neutral position.  No ecchymosis.  Specialty Comments:  No specialty comments available.  Imaging: No results found.   PMFS History: Patient Active Problem List   Diagnosis Date Noted  . Sprain of lateral ligament of ankle joint 07/28/2020  . Screening examination for STD (sexually transmitted disease) 02/01/2020  . Screening for colorectal cancer 12/16/2019  . Encounter for well woman exam with routine gynecological exam 12/16/2019  . Vaginal odor 10/13/2019  . Vaginal discharge 10/13/2019  . Irregular bleeding 10/13/2019  . Urine pregnancy test negative 10/13/2019  . BV (bacterial vaginosis) 10/13/2019  . Family history of breast cancer in mother 08/07/2018  . Family history of colon  cancer in mother 08/07/2018  . Trichomonas infection 07/07/2018   Past Medical History:  Diagnosis Date  . Allergies   . Anxiety   . Chronic back pain   . Medical history non-contributory     Family History  Problem Relation Age of Onset  . Hypertension Mother   . Cancer Mother        breast, colon  . Colon cancer Mother 85       surgery only  . Heart disease Father   . Diabetes Paternal Grandmother   . Hypertension Paternal Grandmother     Past Surgical History:  Procedure Laterality Date  . BREAST BIOPSY Right 2018   Benign  . COLONOSCOPY WITH PROPOFOL N/A 03/03/2020   Procedure: COLONOSCOPY WITH PROPOFOL;  Surgeon: Daneil Dolin, MD;  [facility];  Service: Endoscopy;  Laterality: N/A;  1:45pm  . POLYPECTOMY  03/03/2020   Procedure: POLYPECTOMY;  Surgeon: Daneil Dolin, MD;  [facility];  Service:  Endoscopy;;  . TUBAL LIGATION Bilateral 09/08/2016   Procedure: POST PARTUM TUBAL LIGATION;  Surgeon: Woodroe Mode, MD;  Location: [facility];  Service: Gynecology;  Laterality: Bilateral;   Social History   Occupational History  . Not on file  Tobacco Use  . Smoking status: Never Smoker  . Smokeless tobacco: Never Used  Vaping Use  . Vaping Use: Never used  Substance and Sexual Activity  . Alcohol use: No  . Drug use: No  . Sexual activity: Yes    Birth control/protection: Surgical    Comment: tubal

## 2020-10-31 DIAGNOSIS — Z1331 Encounter for screening for depression: Secondary | ICD-10-CM | POA: Diagnosis not present

## 2020-10-31 DIAGNOSIS — G894 Chronic pain syndrome: Secondary | ICD-10-CM | POA: Diagnosis not present

## 2020-10-31 DIAGNOSIS — Z6824 Body mass index (BMI) 24.0-24.9, adult: Secondary | ICD-10-CM | POA: Diagnosis not present

## 2020-11-10 ENCOUNTER — Ambulatory Visit: Payer: BC Managed Care – PPO | Admitting: Orthopaedic Surgery

## 2020-12-01 ENCOUNTER — Ambulatory Visit (INDEPENDENT_AMBULATORY_CARE_PROVIDER_SITE_OTHER): Payer: Worker's Compensation | Admitting: Orthopaedic Surgery

## 2020-12-01 ENCOUNTER — Other Ambulatory Visit: Payer: Self-pay

## 2020-12-01 DIAGNOSIS — F419 Anxiety disorder, unspecified: Secondary | ICD-10-CM | POA: Diagnosis not present

## 2020-12-01 DIAGNOSIS — G894 Chronic pain syndrome: Secondary | ICD-10-CM | POA: Diagnosis not present

## 2020-12-01 DIAGNOSIS — S93409A Sprain of unspecified ligament of unspecified ankle, initial encounter: Secondary | ICD-10-CM | POA: Diagnosis not present

## 2020-12-01 NOTE — Progress Notes (Signed)
Office Visit Note   Patient: Judy Thompson           Date of Birth: 01-13-1976           MRN: [id number] Visit Date: 12/01/2020              Requested by: Redmond [employer], [address] [place],  [place] [address] PCP: Redmond School, MD   Assessment & Plan: Visit Diagnoses:  1. Sprain of lateral ligament of ankle joint     Plan: Work slip given regular work, released for all activities.  She will finish up her final 4 physical therapy visits and then can do her own strengthening exercises on her own at home.  No impairment assigned patient is released from care.  Follow-Up Instructions: No follow-ups on file.   Orders:  No orders of the defined types were placed in this encounter.  No orders of the defined types were placed in this encounter.     Procedures: No procedures performed   Clinical Data: No additional findings.   Subjective: Chief Complaint  Patient presents with  . Left Ankle - Follow-up    HPI 45 year old female returns for follow-up of on-the-job left ankle injury.  She is working full duty which is 30 hours/week.  Therapist made good progress she has 4 remaining sessions to complete.  She is walking without pain is having improvement in her strength.  She not using her [name] ankle brace currently.  Patient is here with her  rehab nurse, [name].  Review of Systems 14 point system update unchanged.   Objective: Vital Signs: There were no vitals taken for this visit.  Physical Exam Constitutional:      Appearance: She is well-developed.  HENT:     Head: Normocephalic.     Right Ear: External ear normal.     Left Ear: External ear normal.  Eyes:     Pupils: Pupils are equal, round, and reactive to light.  Neck:     Thyroid: No thyromegaly.     Trachea: No tracheal deviation.  Cardiovascular:     Rate and Rhythm: Normal rate.  Pulmonary:     Effort: Pulmonary effort is normal.  Abdominal:     Palpations: Abdomen is  soft.  Skin:    General: Skin is warm and dry.  Neurological:     Mental Status: She is alert and oriented to person, place, and time.  Psychiatric:        Behavior: Behavior normal.     Ortho Exam normal heel toe gait.  She is amatory without limping.  No ankle effusion.  Specialty Comments:  No specialty comments available.  Imaging: No results found.   PMFS History: Patient Active Problem List   Diagnosis Date Noted  . Sprain of lateral ligament of ankle joint 07/28/2020  . Screening examination for STD (sexually transmitted disease) 02/01/2020  . Screening for colorectal cancer 12/16/2019  . Encounter for well woman exam with routine gynecological exam 12/16/2019  . Vaginal odor 10/13/2019  . Vaginal discharge 10/13/2019  . Irregular bleeding 10/13/2019  . Urine pregnancy test negative 10/13/2019  . BV (bacterial vaginosis) 10/13/2019  . Family history of breast cancer in mother 08/07/2018  . Family history of colon cancer in mother 08/07/2018  . Trichomonas infection 07/07/2018   Past Medical History:  Diagnosis Date  . Allergies   . Anxiety   . Chronic back pain   . Medical history non-contributory     Family  History  Problem Relation Age of Onset  . Hypertension Mother   . Cancer Mother        breast, colon  . Colon cancer Mother 18       surgery only  . Heart disease Father   . Diabetes Paternal Grandmother   . Hypertension Paternal Grandmother     Past Surgical History:  Procedure Laterality Date  . BREAST BIOPSY Right 2018   Benign  . COLONOSCOPY WITH PROPOFOL N/A 03/03/2020   Procedure: COLONOSCOPY WITH PROPOFOL;  Surgeon: Daneil Dolin, MD;  [facility];  Service: Endoscopy;  Laterality: N/A;  1:45pm  . POLYPECTOMY  03/03/2020   Procedure: POLYPECTOMY;  Surgeon: Daneil Dolin, MD;  [facility];  Service: Endoscopy;;  . TUBAL LIGATION Bilateral 09/08/2016   Procedure: POST PARTUM TUBAL LIGATION;  Surgeon: Woodroe Mode, MD;  Location: [facility];  Service: Gynecology;  Laterality: Bilateral;   Social History   Occupational History  . Not on file  Tobacco Use  . Smoking status: Never Smoker  . Smokeless tobacco: Never Used  Vaping Use  . Vaping Use: Never used  Substance and Sexual Activity  . Alcohol use: No  . Drug use: No  . Sexual activity: Yes    Birth control/protection: Surgical    Comment: tubal

## 2020-12-27 DIAGNOSIS — G894 Chronic pain syndrome: Secondary | ICD-10-CM | POA: Diagnosis not present

## 2021-01-26 DIAGNOSIS — Z6825 Body mass index (BMI) 25.0-25.9, adult: Secondary | ICD-10-CM | POA: Diagnosis not present

## 2021-01-26 DIAGNOSIS — T781XXA Other adverse food reactions, not elsewhere classified, initial encounter: Secondary | ICD-10-CM | POA: Diagnosis not present

## 2021-01-26 DIAGNOSIS — E663 Overweight: Secondary | ICD-10-CM | POA: Diagnosis not present

## 2021-01-26 DIAGNOSIS — G894 Chronic pain syndrome: Secondary | ICD-10-CM | POA: Diagnosis not present

## 2021-03-01 DIAGNOSIS — G894 Chronic pain syndrome: Secondary | ICD-10-CM | POA: Diagnosis not present

## 2021-03-14 ENCOUNTER — Other Ambulatory Visit (HOSPITAL_COMMUNITY)
Admission: RE | Admit: 2021-03-14 | Discharge: 2021-03-14 | Disposition: A | Discharge status: Home / Self Care | Payer: BC Managed Care – PPO | Source: Ambulatory Visit | Attending: Obstetrics & Gynecology | Admitting: Obstetrics & Gynecology

## 2021-03-14 ENCOUNTER — Other Ambulatory Visit: Payer: Self-pay

## 2021-03-14 ENCOUNTER — Ambulatory Visit: Payer: BC Managed Care – PPO | Admitting: Obstetrics & Gynecology

## 2021-03-14 ENCOUNTER — Encounter: Payer: Self-pay | Admitting: Obstetrics & Gynecology

## 2021-03-14 VITALS — BP 124/66 | HR 87 | Ht 67.0 in | Wt 161.8 lb

## 2021-03-14 DIAGNOSIS — N898 Other specified noninflammatory disorders of vagina: Secondary | ICD-10-CM

## 2021-03-14 DIAGNOSIS — R350 Frequency of micturition: Secondary | ICD-10-CM

## 2021-03-14 NOTE — Progress Notes (Signed)
   GYN VISIT Patient name: DILARA NAVARRETE MRN [id number]  Date of birth: 08/11/1976 Chief Complaint:   Vaginal Irritation  History of Present Illness:   MALINA GEERS is a 45 y.o. G44P1011 female being seen today for vaginitis.  She reports swimming in a pond- last week.   Notes internal vaginal itching, no discharge, no odor.  No fever or chills.  Denies pelvic pain. No improvement with vagisil.  In review of her records- pt has h/o BV  Significant increased in frequency and urgency.  Mild dysuria.  Denies incontinence.  Denies back pain.  Same partner x 14yr.  No recent IC  Contraception: tubal ligation  Depression screen [facility] 2/9 12/16/2019 10/13/2019 08/07/2018 07/24/2017  Decreased Interest 0 0 0 0  Down, Depressed, Hopeless 0 0 0 0  PHQ - 2 Score 0 0 0 0  Altered sleeping - - 0 -  Tired, decreased energy - - 2 -  Suicidal thoughts - - 0 -  PHQ-9 Score - - 2 -  Difficult doing work/chores - - Not difficult at all -     Review of Systems:   Pertinent items are noted in HPI Denies fever/chills, dizziness, headaches, visual disturbances, fatigue, shortness of breath, chest pain, abdominal pain, vomiting, denies problems with periods, bowel movements, or intercourse unless otherwise stated above.  Pertinent History Reviewed:  Reviewed past medical,surgical, social, obstetrical and family history.  Reviewed problem list, medications and allergies. Physical Assessment:   Vitals:   03/14/21 1351  BP: 124/66  Pulse: 87  Weight: 161 lb 12.8 oz (73.4 kg)  Height: 5\' 7"  (1.702 m)  Body mass index is 25.34 kg/m.       Physical Examination:   General appearance: alert, well appearing, and in no distress  Psych: mood appropriate, normal affect  Skin: warm & dry   Cardiovascular: normal heart rate noted  Respiratory: normal respiratory effort, no distress  Abdomen: soft, non-tender   Back: no CVA tenderness  Pelvic: normal external genitalia, vulva, vagina mucosa  appears slightly erythematous, cervix, uterus and adnexa, minimal white discharge noted,   Extremities: no edema   Chaperone: Celene Squibb    Assessment & Plan:  1) Vaginitis -Vaginitis panel- BV/Yeast/Trich obtained- further management pending results -reviewed conservative therapy/avoiding irritants -due to urinary frequency will also r/o underlying UTI- UA/culture obtained   Orders Placed This Encounter  Procedures   Urine Culture    Return if symptoms worsen or fail to improve.   Janyth Pupa, DO Attending [place], [facility] for [employer], [place]

## 2021-03-16 ENCOUNTER — Other Ambulatory Visit: Payer: Self-pay | Admitting: Obstetrics & Gynecology

## 2021-03-16 LAB — CERVICOVAGINAL ANCILLARY ONLY
Bacterial Vaginitis (gardnerella): NEGATIVE
Candida Glabrata: NEGATIVE
Candida Vaginitis: POSITIVE — AB
Chlamydia: NEGATIVE
Comment: NEGATIVE
Comment: NEGATIVE
Comment: NEGATIVE
Comment: NEGATIVE
Comment: NEGATIVE
Comment: NORMAL
Neisseria Gonorrhea: NEGATIVE
Trichomonas: NEGATIVE

## 2021-03-16 MED ORDER — FLUCONAZOLE 150 MG PO TABS
150.0000 mg | ORAL_TABLET | Freq: Once | ORAL | 1 refills | Status: AC
Start: 2021-03-16 — End: 2021-03-16

## 2021-03-16 NOTE — Progress Notes (Signed)
Rx sent in for yeast infection

## 2021-03-17 LAB — URINE CULTURE

## 2021-03-29 DIAGNOSIS — F419 Anxiety disorder, unspecified: Secondary | ICD-10-CM | POA: Diagnosis not present

## 2021-03-29 DIAGNOSIS — K219 Gastro-esophageal reflux disease without esophagitis: Secondary | ICD-10-CM | POA: Diagnosis not present

## 2021-03-29 DIAGNOSIS — G894 Chronic pain syndrome: Secondary | ICD-10-CM | POA: Diagnosis not present

## 2021-04-26 DIAGNOSIS — G894 Chronic pain syndrome: Secondary | ICD-10-CM | POA: Diagnosis not present

## 2021-04-26 DIAGNOSIS — Z6823 Body mass index (BMI) 23.0-23.9, adult: Secondary | ICD-10-CM | POA: Diagnosis not present

## 2021-05-01 ENCOUNTER — Other Ambulatory Visit (HOSPITAL_COMMUNITY): Payer: Self-pay | Admitting: Physician Assistant

## 2021-05-01 DIAGNOSIS — Z1231 Encounter for screening mammogram for malignant neoplasm of breast: Secondary | ICD-10-CM

## 2021-05-22 IMAGING — DX LUMBAR SPINE - 2-3 VIEW
3 series · 3 of 3 positions shown · non-contrast
Comparison: None.

CLINICAL DATA: Right side low back pain since a fall 5-6 months
ago. Initial encounter.

EXAM:
LUMBAR SPINE - 2-3 VIEW

[l-spine ap]
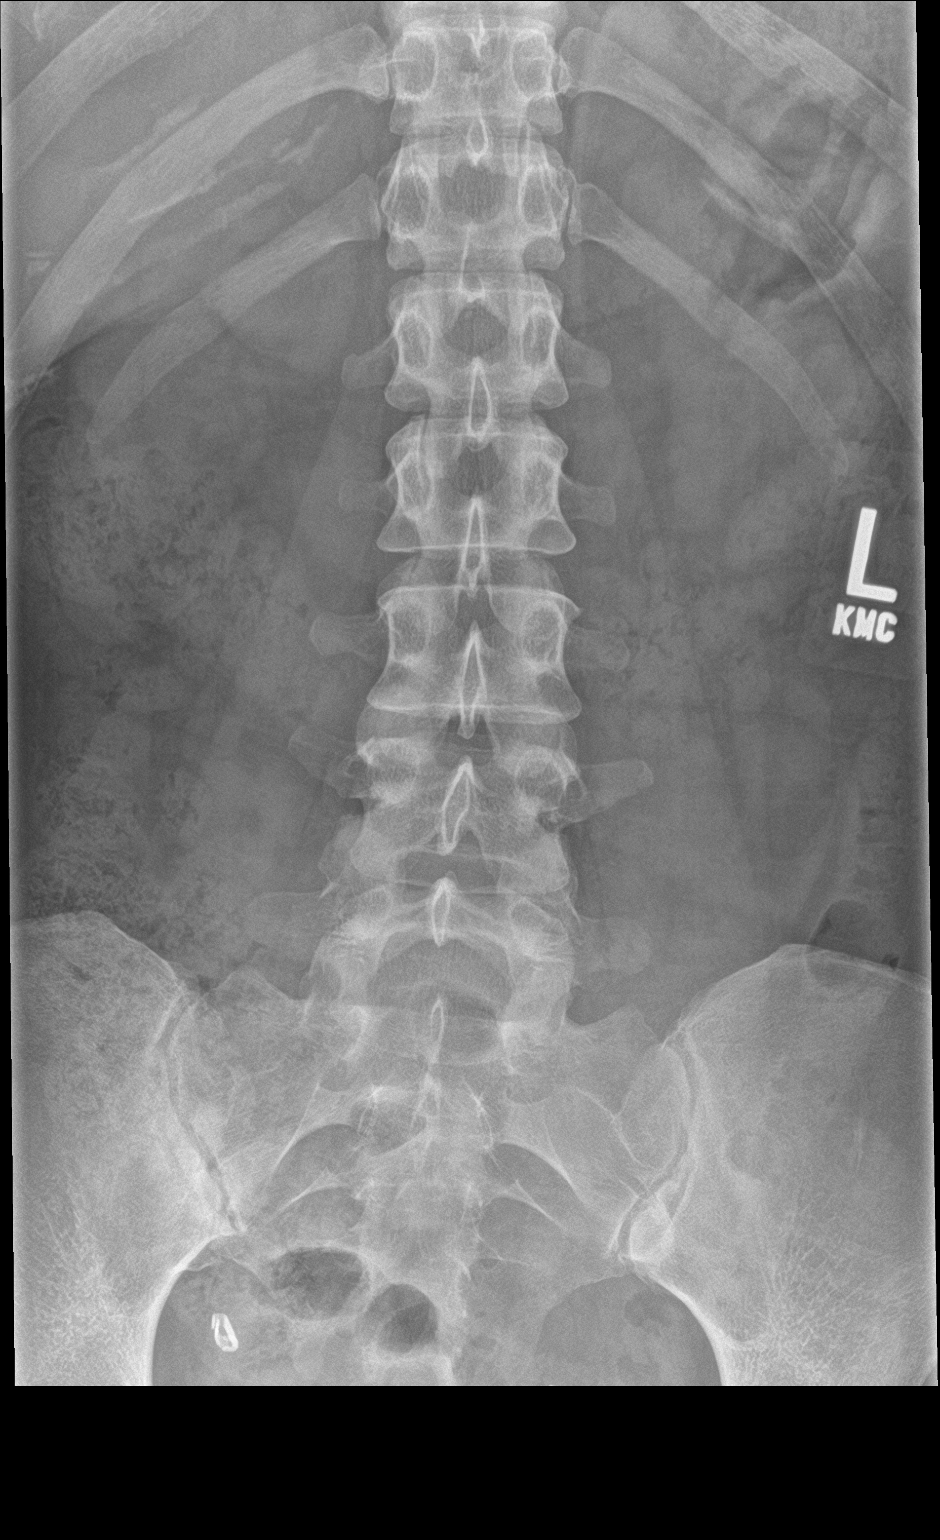

[l-spine lat]
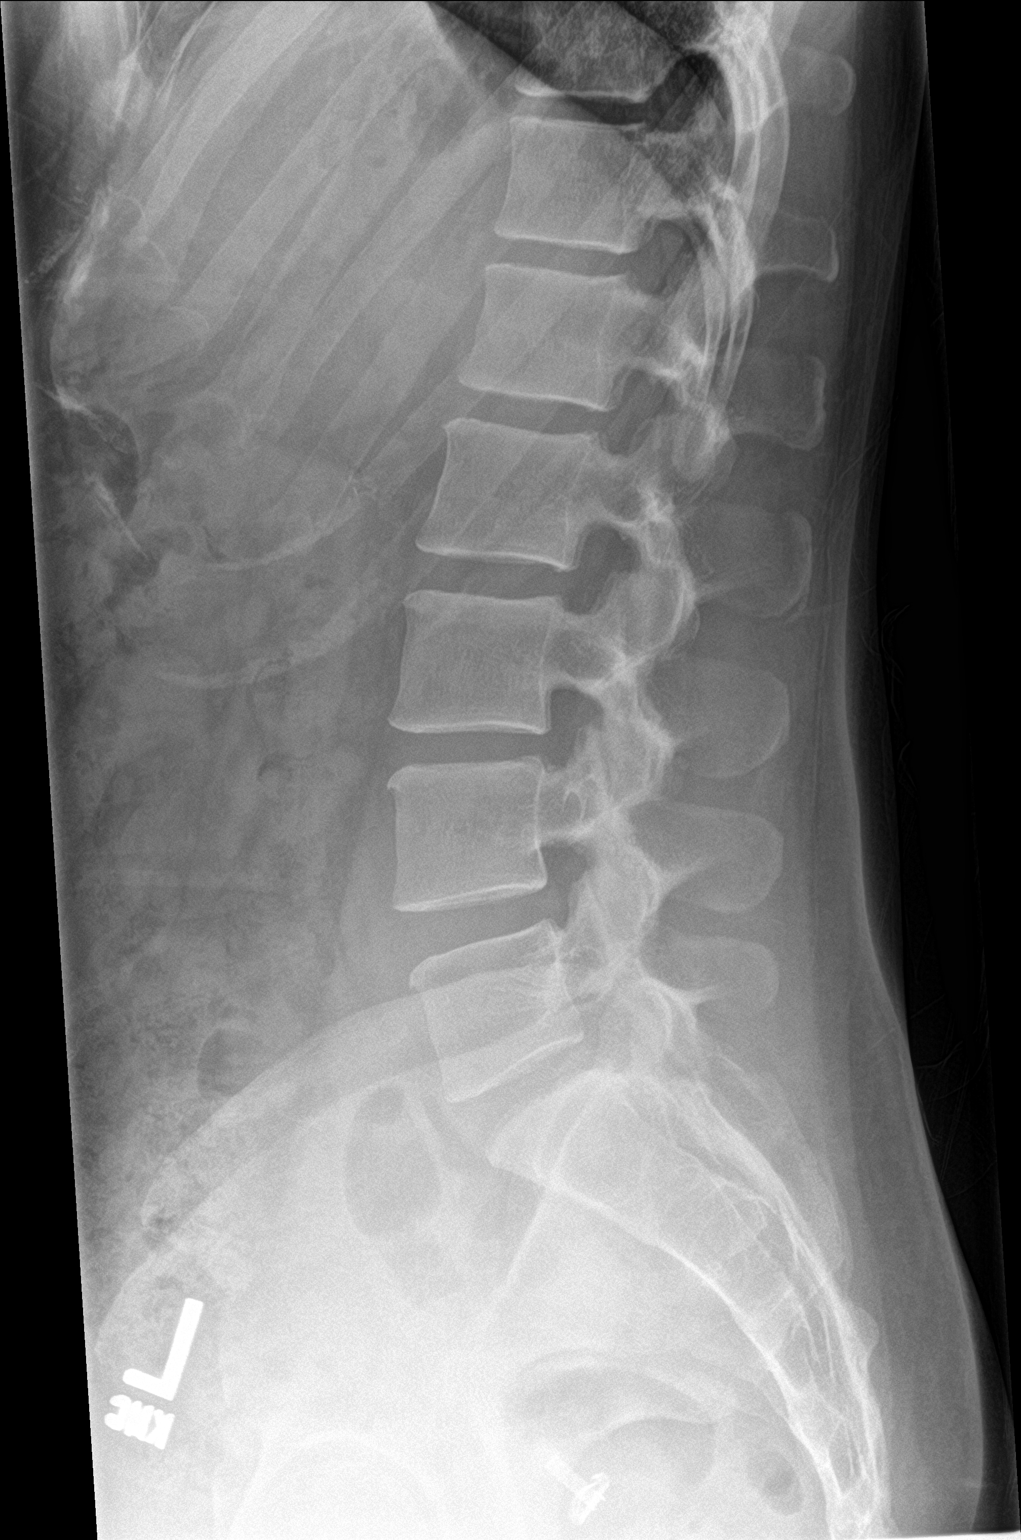

[l-spine spot]
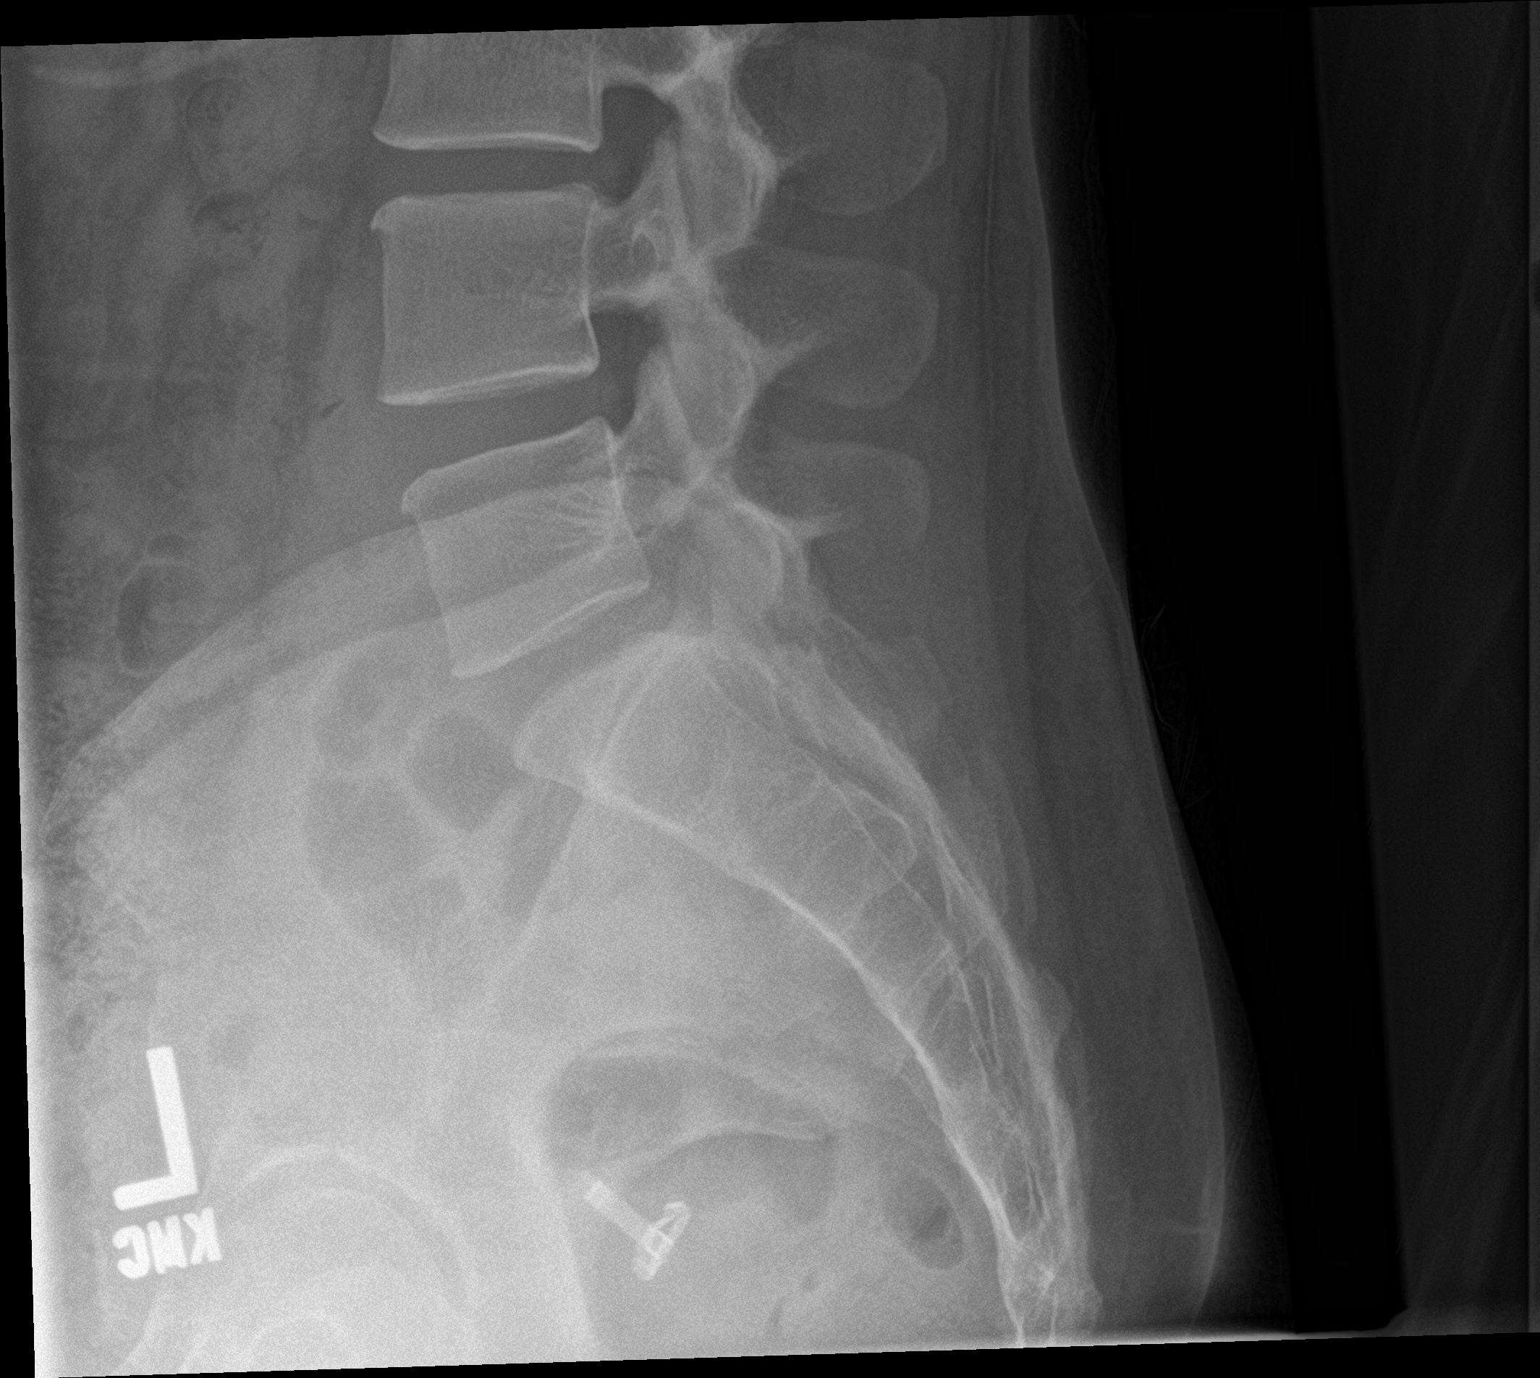

[3 of 3 positions shown; findings below may reference images not displayed]

FINDINGS: There is no evidence of lumbar spine fracture. Alignment is normal.
Intervertebral disc spaces are maintained. Tubal ligation clips
noted.
IMPRESSION: Normal exam.

## 2021-05-30 DIAGNOSIS — G894 Chronic pain syndrome: Secondary | ICD-10-CM | POA: Diagnosis not present

## 2021-06-07 DIAGNOSIS — T148XXA Other injury of unspecified body region, initial encounter: Secondary | ICD-10-CM | POA: Diagnosis not present

## 2021-06-07 DIAGNOSIS — R252 Cramp and spasm: Secondary | ICD-10-CM | POA: Diagnosis not present

## 2021-06-26 ENCOUNTER — Ambulatory Visit (HOSPITAL_COMMUNITY)
Admission: RE | Admit: 2021-06-26 | Discharge: 2021-06-26 | Disposition: A | Discharge status: Home / Self Care | Payer: Medicaid Other | Source: Ambulatory Visit | Attending: Physician Assistant | Admitting: Physician Assistant

## 2021-06-26 ENCOUNTER — Other Ambulatory Visit: Payer: Self-pay

## 2021-06-26 DIAGNOSIS — Z1231 Encounter for screening mammogram for malignant neoplasm of breast: Secondary | ICD-10-CM | POA: Insufficient documentation

## 2022-03-02 ENCOUNTER — Other Ambulatory Visit (HOSPITAL_COMMUNITY): Payer: Self-pay | Admitting: Internal Medicine

## 2022-03-02 DIAGNOSIS — M25511 Pain in right shoulder: Secondary | ICD-10-CM

## 2022-03-08 ENCOUNTER — Ambulatory Visit (HOSPITAL_COMMUNITY)
Admission: RE | Admit: 2022-03-08 | Discharge: 2022-03-08 | Disposition: A | Discharge status: Home / Self Care | Payer: Medicaid Other | Source: Ambulatory Visit | Attending: Internal Medicine | Admitting: Internal Medicine

## 2022-03-08 DIAGNOSIS — M25511 Pain in right shoulder: Secondary | ICD-10-CM | POA: Diagnosis not present

## 2022-11-15 ENCOUNTER — Other Ambulatory Visit (HOSPITAL_COMMUNITY): Payer: Self-pay | Admitting: Internal Medicine

## 2022-11-15 DIAGNOSIS — Z1231 Encounter for screening mammogram for malignant neoplasm of breast: Secondary | ICD-10-CM

## 2022-12-10 ENCOUNTER — Ambulatory Visit (HOSPITAL_COMMUNITY)
Admission: RE | Admit: 2022-12-10 | Discharge: 2022-12-10 | Disposition: A | Discharge status: Home / Self Care | Payer: Medicaid Other | Source: Ambulatory Visit | Attending: Internal Medicine | Admitting: Internal Medicine

## 2022-12-10 DIAGNOSIS — Z1231 Encounter for screening mammogram for malignant neoplasm of breast: Secondary | ICD-10-CM | POA: Diagnosis present

## 2023-03-04 ENCOUNTER — Other Ambulatory Visit (INDEPENDENT_AMBULATORY_CARE_PROVIDER_SITE_OTHER): Payer: Medicaid Other

## 2023-03-04 ENCOUNTER — Encounter: Payer: Self-pay | Admitting: Obstetrics & Gynecology

## 2023-03-04 ENCOUNTER — Encounter: Payer: Self-pay | Admitting: Orthopedic Surgery

## 2023-03-04 ENCOUNTER — Ambulatory Visit (INDEPENDENT_AMBULATORY_CARE_PROVIDER_SITE_OTHER): Payer: Medicaid Other | Admitting: Orthopedic Surgery

## 2023-03-04 ENCOUNTER — Other Ambulatory Visit: Payer: Self-pay

## 2023-03-04 VITALS — BP 130/83 | HR 93 | Ht 67.0 in | Wt 168.0 lb

## 2023-03-04 DIAGNOSIS — M19041 Primary osteoarthritis, right hand: Secondary | ICD-10-CM

## 2023-03-04 DIAGNOSIS — M19042 Primary osteoarthritis, left hand: Secondary | ICD-10-CM

## 2023-03-04 DIAGNOSIS — M19049 Primary osteoarthritis, unspecified hand: Secondary | ICD-10-CM

## 2023-03-04 DIAGNOSIS — M79642 Pain in left hand: Secondary | ICD-10-CM

## 2023-03-04 DIAGNOSIS — M79641 Pain in right hand: Secondary | ICD-10-CM

## 2023-03-04 MED ORDER — MELOXICAM 7.5 MG PO TABS
7.5000 mg | ORAL_TABLET | Freq: Every day | ORAL | 5 refills | Status: AC
Start: 2023-03-04 — End: ?

## 2023-03-04 NOTE — Addendum Note (Signed)
Addended byCaffie Damme on: 03/04/2023 09:39 AM   Modules accepted: Orders

## 2023-03-04 NOTE — Patient Instructions (Signed)
Physical therapy has been ordered for you at [facility]. They should call you to schedule, [phone] is the phone number to call, if you want to call to schedule.   

## 2023-03-04 NOTE — Progress Notes (Signed)
.  dh

## 2023-03-04 NOTE — Progress Notes (Signed)
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  Pinch strength is normal.  Neurovascular exam is intact in the remaining of the hand and range of motion is also normal  It should be noted that there is crepitance on range of motion of the [facility] joint  X-rays were done of both thumbs - she has cmc OA both thumbs  Encounter Diagnoses  Name Primary?   Pain in left hand    Pain in right hand    CMC arthritis Yes    Rec  Nsaids  Function OA splints for the thumb Return 6 wks   Meds ordered this encounter  Medications   meloxicam (MOBIC) 7.5 MG tablet    Sig: Take 1 tablet (7.5 mg total) by mouth daily.    Dispense:  30 tablet    Refill:  5

## 2023-03-14 ENCOUNTER — Encounter (HOSPITAL_COMMUNITY): Payer: Self-pay | Admitting: Occupational Therapy

## 2023-03-14 ENCOUNTER — Ambulatory Visit (HOSPITAL_COMMUNITY): Payer: Medicaid Other | Attending: Orthopedic Surgery | Admitting: Occupational Therapy

## 2023-03-14 ENCOUNTER — Other Ambulatory Visit: Payer: Self-pay

## 2023-03-14 DIAGNOSIS — M79642 Pain in left hand: Secondary | ICD-10-CM | POA: Insufficient documentation

## 2023-03-14 DIAGNOSIS — M19049 Primary osteoarthritis, unspecified hand: Secondary | ICD-10-CM | POA: Diagnosis not present

## 2023-03-14 DIAGNOSIS — M79641 Pain in right hand: Secondary | ICD-10-CM | POA: Insufficient documentation

## 2023-03-14 DIAGNOSIS — M25542 Pain in joints of left hand: Secondary | ICD-10-CM | POA: Diagnosis present

## 2023-03-14 DIAGNOSIS — M25541 Pain in joints of right hand: Secondary | ICD-10-CM | POA: Insufficient documentation

## 2023-03-14 DIAGNOSIS — R29898 Other symptoms and signs involving the musculoskeletal system: Secondary | ICD-10-CM | POA: Diagnosis present

## 2023-03-14 NOTE — Therapy (Signed)
OUTPATIENT OCCUPATIONAL THERAPY ORTHO EVALUATION  Patient Name: Judy Thompson MRN: [id number] DOB:Aug 16, 1976, 47 y.o., female Today's Date: 03/14/2023   END OF SESSION:  OT End of Session - 03/14/23 1328     Visit Number 1    Number of Visits 2    Date for OT Re-Evaluation 04/13/23    Authorization Type [place] Medicaid    Authorization Time Period Requesting 1 visit    Authorization - Visit Number 0    Authorization - Number of Visits 1    OT Start Time 1256    OT Stop Time 1318    OT Time Calculation (min) 22 min    Activity Tolerance Patient tolerated treatment well    Behavior During Therapy WFL for tasks assessed/performed             Past Medical History:  Diagnosis Date   Allergies    Anxiety    Chronic back pain    Medical history non-contributory    Past Surgical History:  Procedure Laterality Date   BREAST BIOPSY Right 2018   Benign   COLONOSCOPY WITH PROPOFOL N/A 03/03/2020   Procedure: COLONOSCOPY WITH PROPOFOL;  Surgeon: Corbin Ade, MD;  [facility];  Service: Endoscopy;  Laterality: N/A;  1:45pm   POLYPECTOMY  03/03/2020   Procedure: POLYPECTOMY;  Surgeon: Corbin Ade, MD;  [facility];  Service: Endoscopy;;   TUBAL LIGATION Bilateral 09/08/2016   Procedure: POST PARTUM TUBAL LIGATION;  Surgeon: Adam Phenix, MD;  Location: [facility];  Service: Gynecology;  Laterality: Bilateral;   Patient Active Problem List   Diagnosis Date Noted   Sprain of lateral ligament of ankle joint 07/28/2020   Screening examination for STD (sexually transmitted disease) 02/01/2020   Screening for colorectal cancer 12/16/2019   Encounter for well woman exam with routine gynecological exam 12/16/2019   Vaginal odor 10/13/2019   Vaginal discharge 10/13/2019   Irregular bleeding 10/13/2019   Urine pregnancy test negative 10/13/2019   BV (bacterial vaginosis) 10/13/2019   Family history of breast cancer in mother 08/07/2018   Family history  of colon cancer in mother 08/07/2018   Trichomonas infection 07/07/2018    PCP: Dr. Elfredia Nevins REFERRING PROVIDER: Dr. Fuller Canada  ONSET DATE: approximately 1 year  REFERRING DIAG: bilateral thumb pain, cmc arthritis  THERAPY DIAG:  Pain in joint of left hand  Pain in joint of right hand  Other symptoms and signs involving the musculoskeletal system  Rationale for Evaluation and Treatment: Rehabilitation  SUBJECTIVE:   SUBJECTIVE STATEMENT: S: They hurt most of the time.  Pt accompanied by: self  PERTINENT HISTORY: Pt is a 46 y/o female presenting with bilateral cmc thumb pain, present for approximately one year. 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 Pt presents wearing radial gutter pre-fabricated splints, reports she does not wear at work but after she is finished working. Does not feel those splints help her very much.   PRECAUTIONS: None  WEIGHT BEARING RESTRICTIONS: No  PAIN:  Are you having pain? Yes: NPRS scale: 2/10 Pain location: bilateral thumbs Pain description: soreness Aggravating factors: working Relieving factors: nothing  FALLS: Has patient fallen in last 6 months? No  PLOF: Independent  PATIENT GOALS: To have less pain  NEXT MD VISIT: None scheduled  OBJECTIVE:   HAND DOMINANCE: Right  ADLs: Overall ADLs: pt with difficulty opening objects, carrying objects sometimes, lifting items at times.  FUNCTIONAL OUTCOME MEASURES: Quick Dash: 22.73  UPPER EXTREMITY ROM:     Bilateral hand ROM is WNL. Full thumb abduction/adduction, flexion/extension, full ROM for making a fist.    HAND FUNCTION: Grip strength: Right: 52 lbs; Left: 45 lbs, Lateral pinch: Right: 15 lbs, Left: 10 lbs, and 3 point pinch: Right: 9 lbs, Left: 9 lbs  SENSATION: WFL  EDEMA: None  COGNITION: Overall cognitive status: Within functional limits for tasks assessed   TODAY'S TREATMENT:                                                                                                                               DATE: N/A-eval only    PATIENT EDUCATION: Education details: joint protection strategies, provided dycem, AE Person educated: Patient Education method: Explanation and Handouts Education comprehension: verbalized understanding  HOME EXERCISE PROGRAM: Eval: joint protection strategies, dycem, AE-rocker knife, jar opener, pill box opener  GOALS: Goals reviewed with patient? Yes  SHORT TERM GOALS: Target date: 04/13/23  Pt with be provided with AE available to protect joints and improve ability to complete ADLs with less pain.   Goal status: INITIAL  2.  Pt will be fitted with bilateral thumb cmc push splints and educated on wear and care.   Goal status: INITIAL  3.  Pt will be educated on joint protection strategies to lessen pain in bilateral thumbs during ADLs.   Goal status: INITIAL   ASSESSMENT:  CLINICAL IMPRESSION: Patient is a 47 y.o. female who was seen today for occupational therapy evaluation for bilateral thumb cmc arthritis and pain. Pt reports her current splints are not helping much. Educated pt on joint protection strategies to improve activity tolerance and reduce pain during work tasks. Fitted for MetaGrip thumb cmc orthosis, pt needs a size 2, OT will order and schedule additional appt for fitting when they arrive.  Pt demonstrating bilateral hand ROM and strength WNL during evaluation today. No sensation changes, no edema. Pt is agreeable to plan.   PERFORMANCE DEFICITS: in functional skills including ADLs, IADLs, pain, and UE functional use  IMPAIRMENTS: are limiting patient from ADLs, IADLs, work, and leisure.   COMORBIDITIES: has no other co-morbidities that affects occupational performance. Patient will benefit from skilled OT to address above impairments and improve overall function.  MODIFICATION OR ASSISTANCE TO COMPLETE EVALUATION: No modification of  tasks or assist necessary to complete an evaluation.  OT OCCUPATIONAL PROFILE AND HISTORY: Problem focused assessment: Including review of records relating to presenting problem.  CLINICAL DECISION MAKING: LOW - limited treatment options, no task modification necessary  REHAB POTENTIAL: Good  EVALUATION COMPLEXITY: Low      PLAN:  OT FREQUENCY: 1x/month  OT DURATION: 4 weeks  PLANNED INTERVENTIONS: self care/ADL training, splinting, patient/family education, and DME and/or AE instructions  RECOMMENDED OTHER SERVICES: None  CONSULTED AND AGREED WITH PLAN OF CARE: Patient  PLAN FOR NEXT SESSION: Fit for metagrip thumb cmc orthosis x2, review joint  protection strategies   Ezra Sites, OTR/L  [phone] 03/14/2023, 1:29 PM

## 2023-04-09 ENCOUNTER — Ambulatory Visit (HOSPITAL_COMMUNITY): Payer: Medicaid Other | Attending: Orthopedic Surgery | Admitting: Occupational Therapy

## 2023-04-09 DIAGNOSIS — R29898 Other symptoms and signs involving the musculoskeletal system: Secondary | ICD-10-CM | POA: Insufficient documentation

## 2023-04-09 DIAGNOSIS — M25541 Pain in joints of right hand: Secondary | ICD-10-CM | POA: Insufficient documentation

## 2023-04-09 DIAGNOSIS — M25542 Pain in joints of left hand: Secondary | ICD-10-CM | POA: Insufficient documentation

## 2023-04-09 NOTE — Therapy (Unsigned)
OUTPATIENT OCCUPATIONAL THERAPY ORTHO TREATMENT AND DISCHARGE NOTE  Patient Name: Judy Thompson MRN: [id number] DOB:August 26, 1976, 47 y.o., female Today's Date: 04/10/2023  OCCUPATIONAL THERAPY DISCHARGE SUMMARY  Visits from Start of Care: 2  Current functional level related to goals / functional outcomes: Patient has met 3 out of 3 goals. She understands joint protections strategies, she already has most AE recommended, and her thumb CMC orthosis have been fit to her with education on wear and care.    Remaining deficits: Pt has no remaining deficits.    Education / Equipment: Pt has been provided a comprehensive HEP, orthosis splints, and AE to improve independence.    Plan: Patient agrees to discharge as all OT goals have been met.     END OF SESSION:  OT End of Session - 04/09/23 1500     Visit Number 2    Number of Visits 2    Date for OT Re-Evaluation 04/13/23    Authorization Type [place] [employer]    Authorization Time Period Requesting 1 visit    Authorization - Visit Number 1    Authorization - Number of Visits 1    OT Start Time 1435    OT Stop Time 1458    OT Time Calculation (min) 23 min    Activity Tolerance Patient tolerated treatment well    Behavior During Therapy WFL for tasks assessed/performed             Past Medical History:  Diagnosis Date   Allergies    Anxiety    Chronic back pain    Medical history non-contributory    Past Surgical History:  Procedure Laterality Date   BREAST BIOPSY Right 2018   Benign   COLONOSCOPY WITH PROPOFOL N/A 03/03/2020   Procedure: COLONOSCOPY WITH PROPOFOL;  Surgeon: Corbin Ade, MD;  Location: [facility];  Service: Endoscopy;  Laterality: N/A;  1:45pm   POLYPECTOMY  03/03/2020   Procedure: POLYPECTOMY;  Surgeon: Corbin Ade, MD;  Location: [facility];  Service: Endoscopy;;   TUBAL LIGATION Bilateral 09/08/2016   Procedure: POST PARTUM TUBAL LIGATION;  Surgeon: Adam Phenix, MD;  Location: [facility];  Service: Gynecology;  Laterality: Bilateral;   Patient Active Problem List   Diagnosis Date Noted   Sprain of lateral ligament of ankle joint 07/28/2020   Screening examination for STD (sexually transmitted disease) 02/01/2020   Screening for colorectal cancer 12/16/2019   Encounter for well woman exam with routine gynecological exam 12/16/2019   Vaginal odor 10/13/2019   Vaginal discharge 10/13/2019   Irregular bleeding 10/13/2019   Urine pregnancy test negative 10/13/2019   BV (bacterial vaginosis) 10/13/2019   Family history of breast cancer in mother 08/07/2018   Family history of colon cancer in mother 08/07/2018   Trichomonas infection 07/07/2018    PCP: Dr. Elfredia Nevins REFERRING PROVIDER: Dr. Fuller Canada  ONSET DATE: approximately 1 year  REFERRING DIAG: bilateral thumb pain, cmc arthritis  THERAPY DIAG:  Pain in joint of left hand  Pain in joint of right hand  Other symptoms and signs involving the musculoskeletal system  Rationale for Evaluation and Treatment: Rehabilitation  SUBJECTIVE:   SUBJECTIVE STATEMENT: S: I can't grip without pain at all. Pt accompanied by: self  PERTINENT HISTORY: Pt is a 47 y/o female presenting with bilateral cmc thumb pain, present for approximately one year. 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 Pt presents wearing radial gutter pre-fabricated splints,  reports she does not wear at work but after she is finished working. Does not feel those splints help her very much.   PRECAUTIONS: None  WEIGHT BEARING RESTRICTIONS: No  PAIN:  Are you having pain? Yes: NPRS scale: 4/10 Pain location: bilateral thumbs Pain description: soreness Aggravating factors: working Relieving factors: nothing  FALLS: Has patient fallen in last 6 months? No  PLOF: Independent  PATIENT GOALS: To have less pain  NEXT MD VISIT: None scheduled  OBJECTIVE:   HAND DOMINANCE:  Right  ADLs: Overall ADLs: pt with difficulty opening objects, carrying objects sometimes, lifting items at times.    FUNCTIONAL OUTCOME MEASURES: Quick Dash: 22.73  UPPER EXTREMITY ROM:     Bilateral hand ROM is WNL. Full thumb abduction/adduction, flexion/extension, full ROM for making a fist.    HAND FUNCTION: Grip strength: Right: 52 lbs; Left: 45 lbs, Lateral pinch: Right: 15 lbs, Left: 10 lbs, and 3 point pinch: Right: 9 lbs, Left: 9 lbs  SENSATION: WFL  EDEMA: None  COGNITION: Overall cognitive status: Within functional limits for tasks assessed   TODAY'S TREATMENT:                                                                                                                              DATE:   04/09/23 -Donned and adjusted bilateral metagrip thumb cmc orthosis, provided education on wear schedule and checking skin for pressure points, as well as cleaning directions.  -Reviewed Joint protection strategies and potential AE for less impact on the thumbs.  -Wrist Strengthening: BUE, 2lb dumbbells, flexion, extension, ulnar/radial deviation, supination/pronation, x10 -Pinch strengthening: BUE, red, green, and blue resistance clips, x6 each, tripod pinch up, lateral pinch down -Gripping: BUE, 35lbs, gripping and picking up 8 medium beads   PATIENT EDUCATION: Education details: Splint wear Person educated: Patient Education method: Explanation and Handouts Education comprehension: verbalized understanding  HOME EXERCISE PROGRAM: Eval: joint protection strategies, dycem, AE-rocker knife, jar opener, pill box opener 7/16: Splint wear  GOALS: Goals reviewed with patient? Yes  SHORT TERM GOALS: Target date: 04/13/23  Pt with be provided with AE available to protect joints and improve ability to complete ADLs with less pain.   Goal status: MET  2.  Pt will be fitted with bilateral thumb cmc push splints and educated on wear and care.   Goal status: MET  3.  Pt  will be educated on joint protection strategies to lessen pain in bilateral thumbs during ADLs.   Goal status: MET   ASSESSMENT:  CLINICAL IMPRESSION: Pt was seen this session for Thumb CMC Orthosis fitting and hand exercises. She reports that overall the thumb orthosis feel support and place compression along the MCP joint during gripping exercises, allowing for pain free gripping. OT and pt reviewed joint protection strategies and pt has no further OT needs or concerns. Pt has met all of her goals and will be discharged from OT at this time.   PERFORMANCE  DEFICITS: in functional skills including ADLs, IADLs, pain, and UE functional use  PLAN:  OT FREQUENCY: 1x/month  OT DURATION: 4 weeks  PLANNED INTERVENTIONS: self care/ADL training, splinting, patient/family education, and DME and/or AE instructions  RECOMMENDED OTHER SERVICES: None  CONSULTED AND AGREED WITH PLAN OF CARE: Patient  PLAN FOR NEXT SESSION: Discharged   Trish Mage, OTR/L [phone] 04/10/2023, 3:00 PM

## 2023-04-12 ENCOUNTER — Other Ambulatory Visit (HOSPITAL_COMMUNITY): Payer: Self-pay | Admitting: Occupational Therapy

## 2023-06-20 ENCOUNTER — Other Ambulatory Visit (HOSPITAL_BASED_OUTPATIENT_CLINIC_OR_DEPARTMENT_OTHER): Payer: Self-pay

## 2023-07-31 ENCOUNTER — Encounter: Payer: Self-pay | Admitting: Neurology

## 2023-07-31 ENCOUNTER — Ambulatory Visit: Payer: Medicaid Other | Admitting: Neurology

## 2023-08-08 ENCOUNTER — Ambulatory Visit: Payer: Medicaid Other | Admitting: Neurology

## 2023-10-16 ENCOUNTER — Other Ambulatory Visit: Payer: Self-pay

## 2023-10-16 ENCOUNTER — Ambulatory Visit: Payer: Medicaid Other | Admitting: Allergy & Immunology

## 2023-10-16 ENCOUNTER — Encounter: Payer: Self-pay | Admitting: Allergy & Immunology

## 2023-10-16 VITALS — BP 124/80 | HR 103 | Temp 98.1°F | Resp 18 | Ht 66.54 in | Wt 165.6 lb

## 2023-10-16 DIAGNOSIS — J310 Chronic rhinitis: Secondary | ICD-10-CM

## 2023-10-16 DIAGNOSIS — G43809 Other migraine, not intractable, without status migrainosus: Secondary | ICD-10-CM

## 2023-10-16 NOTE — Progress Notes (Signed)
NEW PATIENT  Date of Service/Encounter:  10/16/23  Consult requested by: Elfredia Nevins, MD   Assessment:   Chronic rhinitis - will plan for testing at the next visit  Migraines - managed with daily amitriptyline and Nurtec as needed  Chronic back pain - on meloxicam and oxycodone  GERD - quiescent since having her child  Plan/Recommendations:   1. Chronic rhinitis - Because of insurance stipulations, we cannot do skin testing on the same day as your first visit. - We are all working to fight this, but for now we need to do two separate visits.  - We will know more after we do testing at the next visit.  - The skin testing visit can be squeezed in at your convenience.  - Then we can make a more full plan to address all of your symptoms. - Be sure to stop your antihistamines for 3 days before this appointment. - Stop the levocetirizine and the amitriptyline for three days before your next appointment.    2. Migraines - Maybe this testing will help to figure out migraine triggers.   3. Return in about 1 week (around 10/23/2023) for SKIN TESTING. You can have the follow up appointment with Dr. Dellis Anes or a Nurse Practicioner (our Nurse Practitioners are excellent and always have Physician oversight!).   This note in its entirety was forwarded to the Provider who requested this consultation.  Subjective:   Judy Thompson is a 48 y.o. female presenting today for evaluation of  Chief Complaint  Patient presents with   Allergic Rhinitis     Has to change her allergy routine every year would like to get retested for [employer]. Only taking singular for allergies. Has been on it for 3 months     Judy Thompson has a history of the following: Patient Active Problem List   Diagnosis Date Noted   Sprain of lateral ligament of ankle joint 07/28/2020   Screening examination for STD (sexually transmitted disease) 02/01/2020   Screening for colorectal cancer 12/16/2019    Encounter for well woman exam with routine gynecological exam 12/16/2019   Vaginal odor 10/13/2019   Vaginal discharge 10/13/2019   Irregular bleeding 10/13/2019   Urine pregnancy test negative 10/13/2019   BV (bacterial vaginosis) 10/13/2019   Family history of breast cancer in mother 08/07/2018   Family history of colon cancer in mother 08/07/2018   Trichomonas infection 07/07/2018    History obtained from: chart review and patient.  Discussed the use of AI scribe software for clinical note transcription with the patient and/or guardian, who gave verbal consent to proceed.  Judy Thompson was referred by Elfredia Nevins, MD.     Judy Thompson is a 48 y.o. female presenting for an evaluation of chronic rhinitis .   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  She has no history of asthma, however.    Allergic Rhinitis Symptom History: Judy Thompson presents with persistent sneezing and allergic symptoms despite being on multiple antihistamines including Allegra, Zyrtec, Singulair, and most recently, Xyzal (levocetirizine) for the past three months. The allergic symptoms are year-round but exacerbate during the fall and spring. She reports at least one sinus infection per year requiring antibiotics. She also has exposure to animals, including goats and chickens, at home.   Judy Thompson also has a history of migraines, managed with amitriptyline for the past three years, and Nurtec as needed, approximately once a  month. She is under the care of a neurologist for the migraines.  GERD Symptom History: She was on Protonix but she has been doing well without it. She has a has a history of gastroesophageal reflux disease (GERD), which has resolved without any specific dietary changes or interventions. The patient attributes the resolution of GERD symptoms to the birth of her child a year ago.  She has been  suffering from chronic pain for a couple of years and is currently on oxycodone and Mobic, taken twice daily. She fell on a concrete floor and has lower back pain. She sees a Land.   Otherwise, there is no history of other atopic diseases, including drug allergies, environmental allergies, stinging insect allergies, or contact dermatitis. There is no significant infectious history. Vaccinations are up to date.    Past Medical History: Patient Active Problem List   Diagnosis Date Noted   Sprain of lateral ligament of ankle joint 07/28/2020   Screening examination for STD (sexually transmitted disease) 02/01/2020   Screening for colorectal cancer 12/16/2019   Encounter for well woman exam with routine gynecological exam 12/16/2019   Vaginal odor 10/13/2019   Vaginal discharge 10/13/2019   Irregular bleeding 10/13/2019   Urine pregnancy test negative 10/13/2019   BV (bacterial vaginosis) 10/13/2019   Family history of breast cancer in mother 08/07/2018   Family history of colon cancer in mother 08/07/2018   Trichomonas infection 07/07/2018    Medication List:  Allergies as of 10/16/2023   Not on File      Medication List        Accurate as of October 16, 2023 12:57 PM. If you have any questions, ask your nurse or doctor.          STOP taking these medications    pantoprazole 40 MG tablet Commonly known as: PROTONIX Stopped by: Alfonse Spruce   tiZANidine 4 MG tablet Commonly known as: ZANAFLEX Stopped by: Alfonse Spruce       TAKE these medications    ALPRAZolam 0.5 MG tablet Commonly known as: XANAX Take 0.5 mg by mouth 3 (three) times daily as needed for anxiety.   amitriptyline 25 MG tablet Commonly known as: ELAVIL Take 25 mg by mouth daily.   ibuprofen 600 MG tablet Commonly known as: ADVIL TAKE 1 TABLET (600 MG TOTAL) BY MOUTH EVERY 6 (SIX) HOURS. What changed:  when to take this reasons to take this additional instructions    levocetirizine 5 MG tablet Commonly known as: XYZAL Take 5 mg by mouth daily.   meloxicam 7.5 MG tablet Commonly known as: Mobic Take 1 tablet (7.5 mg total) by mouth daily.   NURTEC PO Take 75 mg by mouth as needed (headaches).   Oxycodone HCl 10 MG Tabs Take 10-20 mg by mouth 2 (two) times daily as needed (pain).        Birth History: non-contributory  Developmental History: non-contributory  Past Surgical History: Past Surgical History:  Procedure Laterality Date   BREAST BIOPSY Right 2018   Benign   COLONOSCOPY WITH PROPOFOL N/A 03/03/2020   Procedure: COLONOSCOPY WITH PROPOFOL;  Surgeon: Corbin Ade, MD;  Location: [facility];  Service: Endoscopy;  Laterality: N/A;  1:45pm   POLYPECTOMY  03/03/2020   Procedure: POLYPECTOMY;  Surgeon: Corbin Ade, MD;  Location: [facility];  Service: Endoscopy;;   TUBAL LIGATION Bilateral 09/08/2016   Procedure: POST PARTUM TUBAL LIGATION;  Surgeon: Adam Phenix, MD;  Location: [facility];  Service: Gynecology;  Laterality: Bilateral;     Family History: Family History  Problem Relation Age of Onset   Allergic rhinitis Mother    Hypertension Mother    Cancer Mother        breast, colon   Colon cancer Mother 22       surgery only   Heart disease Father    Diabetes Paternal Grandmother    Hypertension Paternal Grandmother      Social History: Avelyn lives at home with her family.  She lives in a house.  There is wood throughout the home.  There is no mildew damage.  They have avoided eating and central cooling.  There are cats and dogs inside of the home.  There are chickens and goats outside of the home.  There are no dust mite covers on the bedding.  There is no tobacco exposure.  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  She does have fume, chemical, and dust exposure.  She does have a HEPA filter system.  She does not [place] near an interstate or industrial area.   Review of systems otherwise  negative other than that mentioned in the HPI.    Objective:   Blood pressure 124/80, pulse (!) 103, temperature 98.1 F (36.7 C), temperature source Temporal, resp. rate 18, height 5' 6.54" (1.69 m), weight 165 lb 9.6 oz (75.1 kg), SpO2 97%. Body mass index is 26.3 kg/m.     Physical Exam Vitals reviewed.  Constitutional:      Appearance: She is well-developed.     Comments: Talkative.  Pleasant.  HENT:     Head: Normocephalic and atraumatic.     Right Ear: Tympanic membrane, ear canal and external ear normal. No drainage, swelling or tenderness. Tympanic membrane is not injected, scarred, erythematous, retracted or bulging.     Left Ear: Tympanic membrane, ear canal and external ear normal. No drainage, swelling or tenderness. Tympanic membrane is not injected, scarred, erythematous, retracted or bulging.     Nose: No nasal deformity, septal deviation, mucosal edema or rhinorrhea.     Right Turbinates: Enlarged, swollen and pale.     Left Turbinates: Enlarged, swollen and pale.     Right Sinus: No maxillary sinus tenderness or frontal sinus tenderness.     Left Sinus: No maxillary sinus tenderness or frontal sinus tenderness.     Comments: No polyps.  No epistaxis.    Mouth/Throat:     Mouth: Mucous membranes are not pale and not dry.     Pharynx: Uvula midline.  Eyes:     General:        Right eye: No discharge.        Left eye: No discharge.     Conjunctiva/sclera: Conjunctivae normal.     Right eye: Right conjunctiva is not injected. No chemosis.    Left eye: Left conjunctiva is not injected. No chemosis.    Pupils: Pupils are equal, round, and reactive to light.  Cardiovascular:     Rate and Rhythm: Normal rate and regular rhythm.     Heart sounds: Normal heart sounds.  Pulmonary:     Effort: Pulmonary effort is normal. No tachypnea, accessory muscle usage or respiratory distress.     Breath sounds: Normal breath sounds. No wheezing, rhonchi or rales.  Chest:      Chest wall: No tenderness.  Abdominal:     Tenderness: There is no abdominal tenderness. There is no guarding or rebound.  Lymphadenopathy:  Head:     Right side of head: No submandibular, tonsillar or occipital adenopathy.     Left side of head: No submandibular, tonsillar or occipital adenopathy.     Cervical: No cervical adenopathy.  Skin:    Coloration: Skin is not pale.     Findings: No abrasion, erythema, petechiae or rash. Rash is not papular, urticarial or vesicular.  Neurological:     Mental Status: She is alert.  Psychiatric:        Behavior: Behavior is cooperative.      Diagnostic studies: deferred due to insurance stipulations that require a separate visit for testing         Malachi Bonds, MD [facility]

## 2023-10-16 NOTE — Patient Instructions (Addendum)
1. Chronic rhinitis - Because of insurance stipulations, we cannot do skin testing on the same day as your first visit. - We are all working to fight this, but for now we need to do two separate visits.  - We will know more after we do testing at the next visit.  - The skin testing visit can be squeezed in at your convenience.  - Then we can make a more full plan to address all of your symptoms. - Be sure to stop your antihistamines for 3 days before this appointment. - Stop the levocetirizine and the amitriptyline for three days before your next appointment.    2. Migraines - Maybe this testing will help to figure out migraine triggers.   3. Return in about 1 week (around 10/23/2023) for SKIN TESTING. You can have the follow up appointment with Dr. Dellis Anes or a Nurse Practicioner (our Nurse Practitioners are excellent and always have Physician oversight!).    Please inform us of any Emergency Department visits, hospitalizations, or changes in symptoms. Call us before going to the ED for breathing or allergy symptoms since we might be able to fit you in for a sick visit. Feel free to contact us anytime with any questions, problems, or concerns.  It was a pleasure to meet you today!  Websites that have reliable patient information: 1. American Academy of Asthma, Allergy, and Immunology: www.aaaai.org 2. Food Allergy Research and Education (FARE): foodallergy.org 3. Mothers of Asthmatics: http://www.asthmacommunitynetwork.org 4. American College of Allergy, Asthma, and Immunology: www.acaai.org      "Like" [place] on Facebook and Instagram for our latest updates!      A healthy democracy works best when [employer] participate! Make sure you are registered to vote! If you have moved or changed any of your contact information, you will need to get this updated before voting! Scan the QR codes below to learn more!

## 2023-10-25 ENCOUNTER — Ambulatory Visit: Payer: Medicaid Other | Admitting: Allergy & Immunology

## 2023-10-25 DIAGNOSIS — J302 Other seasonal allergic rhinitis: Secondary | ICD-10-CM | POA: Diagnosis not present

## 2023-10-25 DIAGNOSIS — J3089 Other allergic rhinitis: Secondary | ICD-10-CM | POA: Diagnosis not present

## 2023-10-25 NOTE — Progress Notes (Unsigned)
Judy Thompson, Judy Thompson, Judy Thompson, Judy Thompson, Judy Thompson, Judy Thompson, Judy Thompson - Testing today showed: grasses, Judy Thompson, Judy Thompson, Judy Thompson, Judy Thompson, Judy Thompson, Judy cat - Copy of test results provided.  - Avoidance measures provided. - Stop taking: current medications  - Start taking: Singulair (montelukast) 10mg  daily Judy Atrovent (ipratropium) 0.03% one spray per nostril 2-3 times daily as needed (CAN BE OVER DRYING) - You can use an extra dose of the antihistamine, if needed, for breakthrough symptoms.  - Consider nasal saline rinses 1-2 times daily to remove allergens from the nasal cavities as well as help with mucous clearance (this is especially helpful to do before the nasal sprays are given) - Consider allergy shots as a means of long-term control. - Allergy shots "re-train" Judy "reset" the immune system to ignore environmental allergens Judy decrease the resulting immune response to those allergens (sneezing, itchy watery eyes, runny nose, nasal congestion, etc).    - Allergy shots improve symptoms in 75-85% of patients.  - We can discuss more at the next appointment if the medications are not working for you.  2. Migraines - Maybe this testing will help to figure out migraine triggers.   3. Return in about 6 months (around 04/23/2024). You can have the Judy up appointment with Dr. Dellis Anes or a Nurse Practicioner (our Nurse Practitioners are excellent Judy always have Physician oversight!).    Subjective:   Channel J Mullett is a 48 y.o. female presenting today for Judy up of  Chief Complaint  Patient presents with   Allergy Testing    Environmental:  ALL    Romanda J Bodner has a history of the following: Patient Active Problem List   Diagnosis Date Noted   Sprain of lateral ligament of ankle joint 07/28/2020   Screening examination for STD (sexually transmitted disease) 02/01/2020   Screening for colorectal cancer 12/16/2019   Encounter for well woman exam with routine gynecological exam 12/16/2019   Vaginal odor 10/13/2019   Vaginal discharge 10/13/2019   Irregular bleeding 10/13/2019   Urine pregnancy test negative 10/13/2019   BV (bacterial vaginosis) 10/13/2019   Family history of breast cancer in mother 08/07/2018   Family history of colon cancer in mother 08/07/2018   Trichomonas infection 07/07/2018    History obtained from: chart review Judy patient.  Discussed the use of AI scribe software for clinical note transcription with the patient Judy/or guardian, who gave verbal consent to proceed.  Nishita is a 48 y.o. female presenting for skin testing. She was last seen on January 22. We could not do testing because her insurance company does not cover testing on the same day as a New Patient visit. She has been off of all antihistamines 3 days in anticipation of the testing.   At that time, she was very interested in learning about any migraine triggers.  She has been on Allegra, Zyrtec, Singulair, Judy Xyzal without complete relief.  Otherwise, there have been no changes to her past medical history, surgical history, family history, or social history.    Review of systems otherwise negative other than that mentioned in the HPI.    Objective:  There were no vitals taken for this visit. There is no height or weight on file to calculate BMI.    Physical exam deferred since this was a skin testing appointment only.   Diagnostic studies:   Allergy Studies:     Airborne Adult Perc - 10/25/23 1334     Time Antigen Placed 1334    Allergen Manufacturer Waynette Buttery    Location Back    Number of Test 55    Panel 1  Select    1. Control-Buffer 50% Glycerol Negative    2. Control-Histamine 2+    3. Bahia Negative    4. French [place] Territ[place] Negative    5. Johnson Negative    6. Kentucky Blue 2+    7. Meadow Fescue Negative    8. Perennial Rye Negative    9. Timothy 2+    10. Ragweed Mix Negative    11. Cocklebur Negative    12. Plantain,  English Negative    13. Baccharis Negative    14. Dog Fennel Negative    15. Russian Thistle Negative    16. Lamb's Quarters Negative    17. Sheep Sorrell Negative    18. Rough Pigweed Negative    19. Marsh Elder, Rough Negative    20. Mugwort, Common 2+    21. Box, Elder 2+    22. Cedar, red Negative    23. Sweet Gum Negative    24. Pecan Pollen Negative    25. Pine Mix Negative    26. Walnut, Black Pollen 3+    27. Red Mulberry Negative    28. Ash Mix Negative    29. Birch Mix Negative    30. Beech American Negative    31. Cottonwood, Guinea[place]iss[place] Negative    32. Hickory, White Negative    33. Maple Mix Negative    34. Oak, [place] Mix Negative    35. Sycamore Eastern Negative    36. Alternaria Alternata Negative    37. Cladosporium Herbarum 2+    38. Aspergillus Mix Negative    39. Penicillium Mix Negative    40. Bipolaris Sorokiniana (Helminthosporium) Negative    41. Drechslera Spicifera (Curvularia) Negative    42. Mucor Plumbeus Negative    43. Fusarium Moniliforme Negative    44. Aureobasidium Pullulans (pullulara) Negative    45. Rhizopus Oryzae Negative    46. Botrytis Cinera Negative    47. Epicoccum Nigrum Negative    48. Phoma Betae 2+    49. Judy Mite Mix 2+    50. Cat Hair 10,000 BAU/ml 3+    51.  Dog Epithelia Negative    52. Mixed Feathers Negative    53. Horse Epithelia Negative    54. Cockroach, German Negative    55. Tobacco Leaf Negative             Intradermal - 10/25/23 1408     Time Antigen Placed 1408    Allergen Manufacturer Waynette Buttery    Location Arm    Number of Test 11    Control Negative    Bahia Negative    French [place] Territ[place]  Negative    Johnson Negative    Ragweed Mix Negative    Tree Mix Negative    Mold 2 Negative    Mold 3 Negative    Mold 4 Negative    Dog Negative    Cockroach Negative             Allergy testing results were read Judy interpreted by myself, documented by  clinical staff.      Malachi Bonds, MD  [facility]ergy [facility]

## 2023-10-25 NOTE — Patient Instructions (Addendum)
1. Chronic rhinitis - Testing today showed: grasses, weeds, trees, indoor molds, outdoor molds, dust mites, and cat - Copy of test results provided.  - Avoidance measures provided. - Stop taking: current medications  - Start taking: Singulair (montelukast) 10mg  daily and Atrovent (ipratropium) 0.03% one spray per nostril 2-3 times daily as needed (CAN BE OVER DRYING) - You can use an extra dose of the antihistamine, if needed, for breakthrough symptoms.  - Consider nasal saline rinses 1-2 times daily to remove allergens from the nasal cavities as well as help with mucous clearance (this is especially helpful to do before the nasal sprays are given) - Consider allergy shots as a means of long-term control. - Allergy shots "re-train" and "reset" the immune system to ignore environmental allergens and decrease the resulting immune response to those allergens (sneezing, itchy watery eyes, runny nose, nasal congestion, etc).    - Allergy shots improve symptoms in 75-85% of patients.  - We can discuss more at the next appointment if the medications are not working for you.  2. Migraines - Maybe this testing will help to figure out migraine triggers.   3. Return in about 6 months (around 04/23/2024). You can have the follow up appointment with Dr. Dellis Anes or a Nurse Practicioner (our Nurse Practitioners are excellent and always have Physician oversight!).    Please inform us of any Emergency Department visits, hospitalizations, or changes in symptoms. Call us before going to the ED for breathing or allergy symptoms since we might be able to fit you in for a sick visit. Feel free to contact us anytime with any questions, problems, or concerns.  It was a pleasure to meet you today!  Websites that have reliable patient information: 1. American Academy of Asthma, Allergy, and Immunology: www.aaaai.org 2. Food Allergy Research and Education (FARE): foodallergy.org 3. Mothers of Asthmatics:  http://www.asthmacommunitynetwork.org 4. American College of Allergy, Asthma, and Immunology: www.acaai.org      "Like" [place] on Facebook and Instagram for our latest updates!      A healthy democracy works best when [employer] participate! Make sure you are registered to vote! If you have moved or changed any of your contact information, you will need to get this updated before voting! Scan the QR codes below to learn more!       Airborne Adult Perc - 10/25/23 1334     Time Antigen Placed 1334    Allergen Manufacturer Waynette Buttery    Location Back    Number of Test 55    Panel 1 Select    1. Control-Buffer 50% Glycerol Negative    2. Control-Histamine 2+    3. Bahia Negative    4. French Southern Territories Negative    5. Johnson Negative    6. Kentucky Blue 2+    7. Meadow Fescue Negative    8. Perennial Rye Negative    9. Timothy 2+    10. Ragweed Mix Negative    11. Cocklebur Negative    12. Plantain,  English Negative    13. Baccharis Negative    14. Dog Fennel Negative    15. Russian Thistle Negative    16. Lamb's Quarters Negative    17. Sheep Sorrell Negative    18. Rough Pigweed Negative    19. Marsh Elder, Rough Negative    20. Mugwort, Common 2+    21. Box, Elder 2+    22. Cedar, red Negative    23. Sweet Gum Negative    24. Pecan Pollen  Negative    25. Pine Mix Negative    26. Walnut, Black Pollen 3+    27. Red Mulberry Negative    28. Ash Mix Negative    29. Birch Mix Negative    30. Beech American Negative    31. Cottonwood, Guinea-Bissau Negative    32. Hickory, White Negative    33. Maple Mix Negative    34. Oak, Guinea-[place] Mix Negative    35. Sycamore Eastern Negative    36. Alternaria Alternata Negative    37. Cladosporium Herbarum 2+    38. Aspergillus Mix Negative    39. Penicillium Mix Negative    40. Bipolaris Sorokiniana (Helminthosporium) Negative    41. Drechslera Spicifera (Curvularia) Negative    42. Mucor Plumbeus Negative    43. Fusarium Moniliforme Negative     44. Aureobasidium Pullulans (pullulara) Negative    45. Rhizopus Oryzae Negative    46. Botrytis Cinera Negative    47. Epicoccum Nigrum Negative    48. Phoma Betae 2+    49. Dust Mite Mix 2+    50. Cat Hair 10,000 BAU/ml 3+    51.  Dog Epithelia Negative    52. Mixed Feathers Negative    53. Horse Epithelia Negative    54. Cockroach, German Negative    55. Tobacco Leaf Negative             Intradermal - 10/25/23 1408     Time Antigen Placed 1408    Allergen Manufacturer Waynette Buttery    Location Arm    Number of Test 11    Control Negative    Bahia Negative    French Southern Territories Negative    Johnson Negative    Ragweed Mix Negative    Tree Mix Negative    Mold 2 Negative    Mold 3 Negative    Mold 4 Negative    Dog Negative    Cockroach Negative             Reducing Pollen Exposure  The American Academy of Allergy, Asthma and Immunology suggests the following steps to reduce your exposure to pollen during allergy seasons.    Do not hang sheets or clothing out to dry; pollen may collect on these items. Do not mow lawns or spend time around freshly cut grass; mowing stirs up pollen. Keep windows closed at night.  Keep car windows closed while driving. Minimize morning activities outdoors, a time when pollen counts are usually at their highest. Stay indoors as much as possible when pollen counts or humidity is high and on windy days when pollen tends to remain in the air longer. Use air conditioning when possible.  Many air conditioners have filters that trap the pollen spores. Use a HEPA room air filter to remove pollen form the indoor air you breathe.  Control of Mold Allergen   Mold and fungi can grow on a variety of surfaces provided certain temperature and moisture conditions exist.  Outdoor molds grow on plants, decaying vegetation and soil.  The major outdoor mold, Alternaria and Cladosporium, are found in very high numbers during hot and dry conditions.  Generally, a late  Summer - Fall peak is seen for common outdoor fungal spores.  Rain will temporarily lower outdoor mold spore count, but counts rise rapidly when the rainy period ends.  The most important indoor molds are Aspergillus and Penicillium.  Dark, humid and poorly ventilated basements are ideal sites for mold growth.  The next most common sites of  mold growth are the bathroom and the kitchen.  Outdoor (Seasonal) Mold Control  Positive outdoor molds via skin testing: Cladosporium  Use air conditioning and keep windows closed Avoid exposure to decaying vegetation. Avoid leaf raking. Avoid grain handling. Consider wearing a face mask if working in moldy areas.    Indoor (Perennial) Mold Control   Positive indoor molds via skin testing: Phoma  Maintain humidity below 50%. Clean washable surfaces with 5% bleach solution. Remove sources e.g. contaminated carpets.    Control of Dust Mite Allergen    Dust mites play a major role in allergic asthma and rhinitis.  They occur in environments with high humidity wherever human skin is found.  Dust mites absorb humidity from the atmosphere (ie, they do not drink) and feed on organic matter (including shed human and animal skin).  Dust mites are a microscopic type of insect that you cannot see with the naked eye.  High levels of dust mites have been detected from mattresses, pillows, carpets, upholstered furniture, bed covers, clothes, soft toys and any woven material.  The principal allergen of the dust mite is found in its feces.  A gram of dust may contain 1,000 mites and 250,000 fecal particles.  Mite antigen is easily measured in the air during house cleaning activities.  Dust mites do not bite and do not cause harm to humans, other than by triggering allergies/asthma.    Ways to decrease your exposure to dust mites in your home:  Encase mattresses, box springs and pillows with a mite-impermeable barrier or cover   Wash sheets, blankets and drapes  weekly in hot water (130 F) with detergent and dry them in a dryer on the hot setting.  Have the room cleaned frequently with a vacuum cleaner and a damp dust-mop.  For carpeting or rugs, vacuuming with a vacuum cleaner equipped with a high-efficiency particulate air (HEPA) filter.  The dust mite allergic individual should not be in a room which is being cleaned and should wait 1 hour after cleaning before going into the room. Do not sleep on upholstered furniture (eg, couches).   If possible removing carpeting, upholstered furniture and drapery from the home is ideal.  Horizontal blinds should be eliminated in the rooms where the person spends the most time (bedroom, study, television room).  Washable vinyl, roller-type shades are optimal. Remove all non-washable stuffed toys from the bedroom.  Wash stuffed toys weekly like sheets and blankets above.   Reduce indoor humidity to less than 50%.  Inexpensive humidity monitors can be purchased at most hardware stores.  Do not use a humidifier as can make the problem worse and are not recommended.  Control of Dog or Cat Allergen  Avoidance is the best way to manage a dog or cat allergy. If you have a dog or cat and are allergic to dog or cats, consider removing the dog or cat from the home. If you have a dog or cat but don't want to find it a new home, or if your family wants a pet even though someone in the household is allergic, here are some strategies that may help keep symptoms at bay:  Keep the pet out of your bedroom and restrict it to only a few rooms. Be advised that keeping the dog or cat in only one room will not limit the allergens to that room. Don't pet, hug or kiss the dog or cat; if you do, wash your hands with soap and water. High-efficiency particulate air (  HEPA) cleaners run continuously in a bedroom or living room can reduce allergen levels over time. Regular use of a high-efficiency vacuum cleaner or a central vacuum can reduce  allergen levels. Giving your dog or cat a bath at least once a week can reduce airborne allergen.  Allergy Shots  Allergies are the result of a chain reaction that starts in the immune system. Your immune system controls how your body defends itself. For instance, if you have an allergy to pollen, your immune system identifies pollen as an invader or allergen. Your immune system overreacts by producing antibodies called Immunoglobulin E (IgE). These antibodies travel to cells that release chemicals, causing an allergic reaction.  The concept behind allergy immunotherapy, whether it is received in the form of shots or tablets, is that the immune system can be desensitized to specific allergens that trigger allergy symptoms. Although it requires time and patience, the payback can be long-term relief. Allergy injections contain a dilute solution of those substances that you are allergic to based upon your skin testing and allergy history.   How Do Allergy Shots Work?  Allergy shots work much like a vaccine. Your body responds to injected amounts of a particular allergen given in increasing doses, eventually developing a resistance and tolerance to it. Allergy shots can lead to decreased, minimal or no allergy symptoms.  There generally are two phases: build-up and maintenance. Build-up often ranges from three to six months and involves receiving injections with increasing amounts of the allergens. The shots are typically given once or twice a week, though more rapid build-up schedules are sometimes used.  The maintenance phase begins when the most effective dose is reached. This dose is different for each person, depending on how allergic you are and your response to the build-up injections. Once the maintenance dose is reached, there are longer periods between injections, typically two to four weeks.  Occasionally doctors give cortisone-type shots that can temporarily reduce allergy symptoms. These  types of shots are different and should not be confused with allergy immunotherapy shots.  Who Can Be Treated with Allergy Shots?  Allergy shots may be a good treatment approach for people with allergic rhinitis (hay fever), allergic asthma, conjunctivitis (eye allergy) or stinging insect allergy.   Before deciding to begin allergy shots, you should consider:   The length of allergy season and the severity of your symptoms  Whether medications and/or changes to your environment can control your symptoms  Your desire to avoid long-term medication use  Time: allergy immunotherapy requires a major time commitment  Cost: may vary depending on your insurance coverage  Allergy shots for children age 75 and older are effective and often well tolerated. They might prevent the onset of new allergen sensitivities or the progression to asthma.  Allergy shots are not started on patients who are pregnant but can be continued on patients who become pregnant while receiving them. In some patients with other medical conditions or who take certain common medications, allergy shots may be of risk. It is important to mention other medications you talk to your allergist.   What are the two types of build-ups offered:   RUSH or Rapid Desensitization -- one day of injections lasting from 8:30-4:30pm, injections every 1 hour.  Approximately half of the build-up process is completed in that one day.  The following week, normal build-up is resumed, and this entails ~16 visits either weekly or twice weekly, until reaching your "maintenance dose" which is continued weekly until  eventually getting spaced out to every month for a duration of 3 to 5 years. The regular build-up appointments are nurse visits where the injections are administered, followed by required monitoring for 30 minutes.    Traditional build-up -- weekly visits for 6 -12 months until reaching "maintenance dose", then continue weekly until eventually  spacing out to every 4 weeks as above. At these appointments, the injections are administered, followed by required monitoring for 30 minutes.     Either way is acceptable, and both are equally effective. With the rush protocol, the advantage is that less time is spent here for injections overall AND you would also reach maintenance dosing faster (which is when the clinical benefit starts to become more apparent). Not everyone is a candidate for rapid desensitization.   IF we proceed with the RUSH protocol, there are premedications which must be taken the day before and the day after the rush only (this includes antihistamines, steroids, and Singulair).  After the rush day, no prednisone or Singulair is required, and we just recommend antihistamines taken on your injection day.  What Is An Estimate of the Costs?  If you are interested in starting allergy injections, please check with your insurance company about your coverage for both allergy vial sets and allergy injections.  Please do so prior to making the appointment to start injections.  The following are CPT codes to give to your insurance company. These are the amounts we BILL to the insurance company, but the amount YOU WILL PAY and WE RECEIVE IS SUBSTANTIALLY LESS and depends on the contracts we have with different insurance companies.   Amount Billed to Insurance One allergy vial set  CPT 95165   $ 1200     Two allergy vial set  CPT 95165   $ 2400     Three allergy vial set  CPT 95165   $ 3600     One injection   CPT 95115   $ 35  Two injections   CPT 95117   $ 40 RUSH (Rapid Desensitization) CPT 95180 x 8 hours $500/hour  Regarding the allergy injections, your co-pay may or may not apply with each injection, so please confirm this with your insurance company. When you start allergy injections, 1 or 2 sets of vials are made based on your allergies.  Not all patients can be on one set of vials. A set of vials lasts 6 months to a year  depending on how quickly you can proceed with your build-up of your allergy injections. Vials are personalized for each patient depending on their specific allergens.  How often are allergy injection given during the build-up period?   Injections are given at least weekly during the build-up period until your maintenance dose is achieved. Per the doctor's discretion, you may have the option of getting allergy injections two times per week during the build-up period. However, there must be at least 48 hours between injections. The build-up period is usually completed within 6-12 months depending on your ability to schedule injections and for adjustments for reactions. When maintenance dose is reached, your injection schedule is gradually changed to every two weeks and later to every three weeks. Injections will then continue every 4 weeks. Usually, injections are continued for a total of 3-5 years.   When Will I Feel Better?  Some may experience decreased allergy symptoms during the build-up phase. For others, it may take as long as 12 months on the maintenance dose. If there is  no improvement after a year of maintenance, your allergist will discuss other treatment options with you.  If you aren't responding to allergy shots, it may be because there is not enough dose of the allergen in your vaccine or there are missing allergens that were not identified during your allergy testing. Other reasons could be that there are high levels of the allergen in your environment or major exposure to non-allergic triggers like tobacco smoke.  What Is the Length of Treatment?  Once the maintenance dose is reached, allergy shots are generally continued for three to five years. The decision to stop should be discussed with your allergist at that time. Some people may experience a permanent reduction of allergy symptoms. Others may relapse and a longer course of allergy shots can be considered.  What Are the Possible  Reactions?  The two types of adverse reactions that can occur with allergy shots are local and systemic. Common local reactions include very mild redness and swelling at the injection site, which can happen immediately or several hours after. Report a delayed reaction from your last injection. These include arm swelling or runny nose, watery eyes or cough that occurs within 12-24 hours after injection. A systemic reaction, which is less common, affects the entire body or a particular body system. They are usually mild and typically respond quickly to medications. Signs include increased allergy symptoms such as sneezing, a stuffy nose or hives.   Rarely, a serious systemic reaction called anaphylaxis can develop. Symptoms include swelling in the throat, wheezing, a feeling of tightness in the chest, nausea or dizziness. Most serious systemic reactions develop within 30 minutes of allergy shots. This is why it is strongly recommended you wait in your doctor's office for 30 minutes after your injections. Your allergist is trained to watch for reactions, and his or her staff is trained and equipped with the proper medications to identify and treat them.   Report to the nurse immediately if you experience any of the following symptoms: swelling, itching or redness of the skin, hives, watery eyes/nose, breathing difficulty, excessive sneezing, coughing, stomach pain, diarrhea, or light headedness. These symptoms may occur within 15-20 minutes after injection and may require medication.   Who Should Administer Allergy Shots?  The preferred location for receiving shots is your prescribing allergist's office. Injections can sometimes be given at another facility where the physician and staff are trained to recognize and treat reactions, and have received instructions by your prescribing allergist.  What if I am late for an injection?   Injection dose will be adjusted depending upon how many days or weeks you  are late for your injection.   What if I am sick?   Please report any illness to the nurse before receiving injections. She may adjust your dose or postpone injections depending on your symptoms. If you have fever, flu, sinus infection or chest congestion it is best to postpone allergy injections until you are better. Never get an allergy injection if your asthma is causing you problems. If your symptoms persist, seek out medical care to get your health problem under control.  What If I am or Become Pregnant:  Women that become pregnant should schedule an appointment with [facility] before receiving any further allergy injections.

## 2023-10-26 ENCOUNTER — Encounter: Payer: Self-pay | Admitting: Allergy & Immunology

## 2023-10-26 MED ORDER — IPRATROPIUM BROMIDE 0.03 % NA SOLN: NASAL | 5 refills | Status: DC

## 2023-10-26 MED ORDER — MONTELUKAST SODIUM 10 MG PO TABS: 10.0000 mg | ORAL_TABLET | Freq: Every day | ORAL | 5 refills | Status: DC

## 2024-01-22 ENCOUNTER — Encounter: Payer: Self-pay | Admitting: Adult Health

## 2024-01-22 ENCOUNTER — Other Ambulatory Visit (HOSPITAL_COMMUNITY)
Admission: RE | Admit: 2024-01-22 | Discharge: 2024-01-22 | Disposition: A | Discharge status: Home / Self Care | Source: Ambulatory Visit | Attending: Adult Health | Admitting: Adult Health

## 2024-01-22 ENCOUNTER — Ambulatory Visit: Admitting: Adult Health

## 2024-01-22 VITALS — BP 127/80 | HR 79 | Ht 67.0 in | Wt 156.0 lb

## 2024-01-22 DIAGNOSIS — Z124 Encounter for screening for malignant neoplasm of cervix: Secondary | ICD-10-CM | POA: Insufficient documentation

## 2024-01-22 DIAGNOSIS — R350 Frequency of micturition: Secondary | ICD-10-CM | POA: Diagnosis not present

## 2024-01-22 DIAGNOSIS — R102 Pelvic and perineal pain: Secondary | ICD-10-CM

## 2024-01-22 DIAGNOSIS — N941 Unspecified dyspareunia: Secondary | ICD-10-CM

## 2024-01-22 LAB — POCT URINALYSIS DIPSTICK
Blood, UA: NEGATIVE
Glucose, UA: NEGATIVE
Ketones, UA: NEGATIVE
Leukocytes, UA: NEGATIVE
Nitrite, UA: NEGATIVE
Protein, UA: NEGATIVE
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
## 2024-01-24 LAB — URINALYSIS, ROUTINE W REFLEX MICROSCOPIC
Bilirubin, UA: NEGATIVE
Glucose, UA: NEGATIVE
Ketones, UA: NEGATIVE
Leukocytes,UA: NEGATIVE
Nitrite, UA: NEGATIVE
Protein,UA: NEGATIVE
RBC, UA: NEGATIVE
Specific Gravity, UA: 1.009 (ref 1.005–1.030)
Urobilinogen, Ur: 0.2 mg/dL (ref 0.2–1.0)
pH, UA: 6 (ref 5.0–7.5)

## 2024-01-25 LAB — URINE CULTURE: Organism ID, Bacteria: NO GROWTH

## 2024-01-27 LAB — CYTOLOGY - PAP
Chlamydia: NEGATIVE
Comment: NEGATIVE
Comment: NEGATIVE
Comment: NORMAL
Diagnosis: NEGATIVE
High risk HPV: NEGATIVE
Neisseria Gonorrhea: NEGATIVE

## 2024-02-05 ENCOUNTER — Other Ambulatory Visit: Admitting: Radiology

## 2024-02-12 ENCOUNTER — Ambulatory Visit (HOSPITAL_COMMUNITY)
Admission: RE | Admit: 2024-02-12 | Discharge: 2024-02-12 | Disposition: A | Discharge status: Home / Self Care | Source: Ambulatory Visit | Attending: Adult Health | Admitting: Adult Health

## 2024-02-12 DIAGNOSIS — R102 Pelvic and perineal pain: Secondary | ICD-10-CM | POA: Diagnosis present

## 2024-02-12 DIAGNOSIS — N941 Unspecified dyspareunia: Secondary | ICD-10-CM | POA: Diagnosis present

## 2024-02-20 ENCOUNTER — Ambulatory Visit: Payer: Self-pay | Admitting: Adult Health

## 2024-03-16 ENCOUNTER — Other Ambulatory Visit (HOSPITAL_COMMUNITY): Payer: Self-pay | Admitting: Internal Medicine

## 2024-03-16 ENCOUNTER — Ambulatory Visit (HOSPITAL_COMMUNITY)
Admission: RE | Admit: 2024-03-16 | Discharge: 2024-03-16 | Disposition: A | Discharge status: Home / Self Care | Source: Ambulatory Visit | Attending: Internal Medicine | Admitting: Internal Medicine

## 2024-03-16 DIAGNOSIS — R52 Pain, unspecified: Secondary | ICD-10-CM

## 2024-04-29 ENCOUNTER — Ambulatory Visit: Payer: Medicaid Other | Admitting: Allergy & Immunology

## 2024-04-30 ENCOUNTER — Other Ambulatory Visit: Payer: Self-pay | Admitting: Allergy & Immunology

## 2024-06-23 ENCOUNTER — Other Ambulatory Visit: Payer: Self-pay | Admitting: Internal Medicine

## 2024-06-23 DIAGNOSIS — Z1231 Encounter for screening mammogram for malignant neoplasm of breast: Secondary | ICD-10-CM

## 2024-08-07 ENCOUNTER — Ambulatory Visit
Admission: RE | Admit: 2024-08-07 | Discharge: 2024-08-07 | Disposition: A | Discharge status: Home / Self Care | Source: Ambulatory Visit | Attending: Internal Medicine | Admitting: Internal Medicine

## 2024-08-07 DIAGNOSIS — Z1231 Encounter for screening mammogram for malignant neoplasm of breast: Secondary | ICD-10-CM

## 2024-10-28 ENCOUNTER — Ambulatory Visit: Admitting: Allergy & Immunology

## 2024-10-28 VITALS — BP 100/80 | HR 98 | Temp 98.2°F | Ht 66.54 in | Wt 162.8 lb

## 2024-10-28 DIAGNOSIS — J302 Other seasonal allergic rhinitis: Secondary | ICD-10-CM

## 2024-10-28 DIAGNOSIS — G43809 Other migraine, not intractable, without status migrainosus: Secondary | ICD-10-CM

## 2024-10-28 MED ORDER — MONTELUKAST SODIUM 10 MG PO TABS: 10.0000 mg | ORAL_TABLET | Freq: Every day | ORAL | 3 refills | Status: AC

## 2024-10-28 MED ORDER — IPRATROPIUM BROMIDE 0.03 % NA SOLN: NASAL | 5 refills | Status: AC

## 2024-10-28 NOTE — Patient Instructions (Addendum)
 1. Chronic rhinitis - Testing in the past has showed: grasses, weeds, trees, indoor molds, outdoor molds, dust mites, and cat - Definitely get a HEPA filter (air purifier) for your bedroom. - Continue taking: Singulair  (montelukast ) 10mg  daily and Atrovent  (ipratropium) 0.03% one spray per nostril 2-3 times daily as needed (CAN BE OVER DRYING) - You can use an extra dose of the antihistamine, if needed, for breakthrough symptoms.  - Consider nasal saline rinses 1-2 times daily to remove allergens from the nasal cavities as well as help with mucous clearance (this is especially helpful to do before the nasal sprays are given) - Consider allergy  shots as a means of long-term control. - Allergy  shots re-train and reset the immune system to ignore environmental allergens and decrease the resulting immune response to those allergens (sneezing, itchy watery eyes, runny nose, nasal congestion, etc).    - Allergy  shots improve symptoms in 75-85% of patients.  - We can discuss more at the next appointment if the medications are not working for you.  2. Migraines - Maybe this testing will help to figure out migraine triggers.   3. Return in about 1 year (around 10/28/2025). You can have the follow up appointment with Dr. Iva or a Nurse Practicioner (our Nurse Practitioners are excellent and always have Physician oversight!).    Please inform us  of any Emergency Department visits, hospitalizations, or changes in symptoms. Call us  before going to the ED for breathing or allergy  symptoms since we might be able to fit you in for a sick visit. Feel free to contact us  anytime with any questions, problems, or concerns.  It was a pleasure to meet you today!  Websites that have reliable patient information: 1. American Academy of Asthma, Allergy , and Immunology: www.aaaai.org 2. Food Allergy  Research and Education (FARE): foodallergy.org 3. Mothers of Asthmatics:  http://www.asthmacommunitynetwork.org 4. [employer] of Allergy , Asthma, and Immunology: www.acaai.org      Like us  on [employer] and Instagram for our latest updates!      A healthy democracy works best when [employer] participate! Make sure you are registered to vote! If you have moved or changed any of your contact information, you will need to get this updated before voting! Scan the QR codes below to learn more!       Allergy  Shots  Allergies are the result of a chain reaction that starts in the immune system. Your immune system controls how your body defends itself. For instance, if you have an allergy  to pollen, your immune system identifies pollen as an invader or allergen. Your immune system overreacts by producing antibodies called Immunoglobulin E (IgE). These antibodies travel to cells that release chemicals, causing an allergic reaction.  The concept behind allergy  immunotherapy, whether it is received in the form of shots or tablets, is that the immune system can be desensitized to specific allergens that trigger allergy  symptoms. Although it requires time and patience, the payback can be long-term relief. Allergy  injections contain a dilute solution of those substances that you are allergic to based upon your skin testing and allergy  history.   How Do Allergy  Shots Work?  Allergy  shots work much like a vaccine. Your body responds to injected amounts of a particular allergen given in increasing doses, eventually developing a resistance and tolerance to it. Allergy  shots can lead to decreased, minimal or no allergy  symptoms.  There generally are two phases: build-up and maintenance. Build-up often ranges from three to six months and involves receiving injections with  increasing amounts of the allergens. The shots are typically given once or twice a week, though more rapid build-up schedules are sometimes used.  The maintenance phase begins when the most effective dose  is reached. This dose is different for each person, depending on how allergic you are and your response to the build-up injections. Once the maintenance dose is reached, there are longer periods between injections, typically two to four weeks.  Occasionally doctors give cortisone-type shots that can temporarily reduce allergy  symptoms. These types of shots are different and should not be confused with allergy  immunotherapy shots.  Who Can Be Treated with Allergy  Shots?  Allergy  shots may be a good treatment approach for people with allergic rhinitis (hay fever), allergic asthma, conjunctivitis (eye allergy ) or stinging insect allergy .   Before deciding to begin allergy  shots, you should consider:   The length of allergy  season and the severity of your symptoms  Whether medications and/or changes to your environment can control your symptoms  Your desire to avoid long-term medication use  Time: allergy  immunotherapy requires a major time commitment  Cost: may vary depending on your insurance coverage  Allergy  shots for children age 49 and older are effective and often well tolerated. They might prevent the onset of new allergen sensitivities or the progression to asthma.  Allergy  shots are not started on patients who are pregnant but can be continued on patients who become pregnant while receiving them. In some patients with other medical conditions or who take certain common medications, allergy  shots may be of risk. It is important to mention other medications you talk to your allergist.   What are the two types of build-ups offered:   RUSH or Rapid Desensitization -- one day of injections lasting from 8:30-4:30pm, injections every 1 hour.  Approximately half of the build-up process is completed in that one day.  The following week, normal build-up is resumed, and this entails ~16 visits either weekly or twice weekly, until reaching your maintenance dose which is continued weekly until  eventually getting spaced out to every month for a duration of 3 to 5 years. The regular build-up appointments are nurse visits where the injections are administered, followed by required monitoring for 30 minutes.    Traditional build-up -- weekly visits for 6 -12 months until reaching maintenance dose, then continue weekly until eventually spacing out to every 4 weeks as above. At these appointments, the injections are administered, followed by required monitoring for 30 minutes.     Either way is acceptable, and both are equally effective. With the rush protocol, the advantage is that less time is spent here for injections overall AND you would also reach maintenance dosing faster (which is when the clinical benefit starts to become more apparent). Not everyone is a candidate for rapid desensitization.   IF we proceed with the RUSH protocol, there are premedications which must be taken the day before and the day after the rush only (this includes antihistamines, steroids, and Singulair ).  After the rush day, no prednisone or Singulair  is required, and we just recommend antihistamines taken on your injection day.  What Is An Estimate of the Costs?  If you are interested in starting allergy  injections, please check with your insurance company about your coverage for both allergy  vial sets and allergy  injections.  Please do so prior to making the appointment to start injections.  The following are CPT codes to give to your insurance company. These are the amounts we BILL to the  insurance company, but the amount YOU WILL PAY and WE RECEIVE IS SUBSTANTIALLY LESS and depends on the contracts we have with different insurance companies.   Amount Billed to Insurance One allergy  vial set  CPT 95165   $ 1200     Two allergy  vial set  CPT 95165   $ 2400     Three allergy  vial set  CPT 95165   $ 3600     One injection   CPT 95115   $ 35  Two injections   CPT 95117   $ 40 RUSH (Rapid Desensitization) CPT  95180 x 8 hours $500/hour  Regarding the allergy  injections, your co-pay may or may not apply with each injection, so please confirm this with your insurance company. When you start allergy  injections, 1 or 2 sets of vials are made based on your allergies.  Not all patients can be on one set of vials. A set of vials lasts 6 months to a year depending on how quickly you can proceed with your build-up of your allergy  injections. Vials are personalized for each patient depending on their specific allergens.  How often are allergy  injection given during the build-up period?   Injections are given at least weekly during the build-up period until your maintenance dose is achieved. Per the doctor's discretion, you may have the option of getting allergy  injections two times per week during the build-up period. However, there must be at least 48 hours between injections. The build-up period is usually completed within 6-12 months depending on your ability to schedule injections and for adjustments for reactions. When maintenance dose is reached, your injection schedule is gradually changed to every two weeks and later to every three weeks. Injections will then continue every 4 weeks. Usually, injections are continued for a total of 3-5 years.   When Will I Feel Better?  Some may experience decreased allergy  symptoms during the build-up phase. For others, it may take as long as 12 months on the maintenance dose. If there is no improvement after a year of maintenance, your allergist will discuss other treatment options with you.  If you arent responding to allergy  shots, it may be because there is not enough dose of the allergen in your vaccine or there are missing allergens that were not identified during your allergy  testing. Other reasons could be that there are high levels of the allergen in your environment or major exposure to non-allergic triggers like tobacco smoke.  What Is the Length of  Treatment?  Once the maintenance dose is reached, allergy  shots are generally continued for three to five years. The decision to stop should be discussed with your allergist at that time. Some people may experience a permanent reduction of allergy  symptoms. Others may relapse and a longer course of allergy  shots can be considered.  What Are the Possible Reactions?  The two types of adverse reactions that can occur with allergy  shots are local and systemic. Common local reactions include very mild redness and swelling at the injection site, which can happen immediately or several hours after. Report a delayed reaction from your last injection. These include arm swelling or runny nose, watery eyes or cough that occurs within 12-24 hours after injection. A systemic reaction, which is less common, affects the entire body or a particular body system. They are usually mild and typically respond quickly to medications. Signs include increased allergy  symptoms such as sneezing, a stuffy nose or hives.   Rarely, a serious systemic reaction  called anaphylaxis can develop. Symptoms include swelling in the throat, wheezing, a feeling of tightness in the chest, nausea or dizziness. Most serious systemic reactions develop within 30 minutes of allergy  shots. This is why it is strongly recommended you wait in your doctors office for 30 minutes after your injections. Your allergist is trained to watch for reactions, and his or her staff is trained and equipped with the proper medications to identify and treat them.   Report to the nurse immediately if you experience any of the following symptoms: swelling, itching or redness of the skin, hives, watery eyes/nose, breathing difficulty, excessive sneezing, coughing, stomach pain, diarrhea, or light headedness. These symptoms may occur within 15-20 minutes after injection and may require medication.   Who Should Administer Allergy  Shots?  The preferred location for  receiving shots is your prescribing allergists office. Injections can sometimes be given at another facility where the physician and staff are trained to recognize and treat reactions, and have received instructions by your prescribing allergist.  What if I am late for an injection?   Injection dose will be adjusted depending upon how many days or weeks you are late for your injection.   What if I am sick?   Please report any illness to the nurse before receiving injections. She may adjust your dose or postpone injections depending on your symptoms. If you have fever, flu, sinus infection or chest congestion it is best to postpone allergy  injections until you are better. Never get an allergy  injection if your asthma is causing you problems. If your symptoms persist, seek out medical care to get your health problem under control.  What If I am or Become Pregnant:  Women that become pregnant should schedule an appointment with [facility] before receiving any further allergy  injections.  Check out this information handout from the [employer] of Asthma, Allergy , and Immunology on allergy  shots!   [email or web address]

## 2024-10-28 NOTE — Progress Notes (Unsigned)
 "  FOLLOW UP  Date of Service/Encounter:  10/28/24   Assessment:   Perennial and seasonal allergic rhinitis (grasses, weeds, trees, indoor molds, outdoor molds, dust mites, and cat)   Migraines - managed with daily amitriptyline and Nurtec as needed   Chronic back pain - on meloxicam  and oxycodone    GERD - quiescent since having her child    Plan/Recommendations:   There are no Patient Instructions on file for this visit.   Subjective:   Judy Thompson is a 49 y.o. female presenting today for follow up of No chief complaint on file.   Judy Thompson has a history of the following: Patient Active Problem List   Diagnosis Date Noted   Dyspareunia in female 01/22/2024   Pelvic pain 01/22/2024   Routine Papanicolaou smear 01/22/2024   Urinary frequency 01/22/2024   Sprain of lateral ligament of ankle joint 07/28/2020   Screening examination for STD (sexually transmitted disease) 02/01/2020   Screening for colorectal cancer 12/16/2019   Encounter for well woman exam with routine gynecological exam 12/16/2019   Vaginal odor 10/13/2019   Vaginal discharge 10/13/2019   Irregular bleeding 10/13/2019   Urine pregnancy test negative 10/13/2019   BV (bacterial vaginosis) 10/13/2019   Family history of breast cancer in mother 08/07/2018   Family history of colon cancer in mother 08/07/2018   Trichomonas infection 07/07/2018    History obtained from: chart review and {Persons; PED relatives w/patient:19415::patient}.  Discussed the use of AI scribe software for clinical note transcription with the patient and/or guardian, who gave verbal consent to proceed.  Judy Thompson is a 49 y.o. female presenting for {Blank single:19197::a food challenge,a drug challenge,skin testing,a sick visit,an evaluation of ***,a follow up visit}.  Asthma/Respiratory Symptom History: ***  Allergic Rhinitis Symptom History: ***  Food Allergy  Symptom History:  ***  Skin Symptom History: ***  GERD Symptom History: ***  Infection Symptom History: ***  Otherwise, there have been no changes to her past medical history, surgical history, family history, or social history.    Review of systems otherwise negative other than that mentioned in the HPI.    Objective:   There were no vitals taken for this visit. There is no height or weight on file to calculate BMI.    Physical Exam   Diagnostic studies: {Blank single:19197::none,deferred due to recent antihistamine use,deferred due to insurance stipulations that require a separate visit for testing,labs sent instead, }  Spirometry: {Blank single:19197::results normal (FEV1: ***%, FVC: ***%, FEV1/FVC: ***%),results abnormal (FEV1: ***%, FVC: ***%, FEV1/FVC: ***%)}.    {Blank single:19197::Spirometry consistent with mild obstructive disease,Spirometry consistent with moderate obstructive disease,Spirometry consistent with severe obstructive disease,Spirometry consistent with possible restrictive disease,Spirometry consistent with mixed obstructive and restrictive disease,Spirometry uninterpretable due to technique,Spirometry consistent with normal pattern}. {Blank single:19197::Albuterol/Atrovent  nebulizer,Xopenex/Atrovent  nebulizer,Albuterol nebulizer,Albuterol four puffs via MDI,Xopenex four puffs via MDI} treatment given in clinic with {Blank single:19197::significant improvement in FEV1 per ATS criteria,significant improvement in FVC per ATS criteria,significant improvement in FEV1 and FVC per ATS criteria,improvement in FEV1, but not significant per ATS criteria,improvement in FVC, but not significant per ATS criteria,improvement in FEV1 and FVC, but not significant per ATS criteria,no improvement}.  Allergy  Studies: {Blank single:19197::none,deferred due to recent antihistamine use,deferred due to insurance stipulations that require a  separate visit for testing,labs sent instead, }    {Blank single:19197::Allergy  testing results were read and interpreted by myself, documented by clinical staff., }      Judy Shaggy, MD  [facility]        "

## 2024-10-30 ENCOUNTER — Encounter: Payer: Self-pay | Admitting: Allergy & Immunology

## 2024-10-30 ENCOUNTER — Telehealth: Payer: Self-pay | Admitting: *Deleted

## 2024-10-30 NOTE — Telephone Encounter (Signed)
 Called and left a voicemail asking for a return call to discuss.  ?

## 2024-10-30 NOTE — Telephone Encounter (Signed)
-----   Message from Marty Shaggy, MD sent at 10/30/2024  2:09 PM EST ----- Can someone call and see if she want to start shots?  I did not write a prescription yet.  She was going to check with insurance.

## 2025-10-29 ENCOUNTER — Ambulatory Visit: Admitting: Allergy & Immunology
